# Patient Record
Sex: Female | Born: 1975
Health system: Southern US, Community
[De-identification: ages and names within clinical notes are randomized; demographics above are authoritative.]

## PROBLEM LIST (undated history)

## (undated) DIAGNOSIS — T4145XA Adverse effect of unspecified anesthetic, initial encounter: Secondary | ICD-10-CM

## (undated) DIAGNOSIS — T8859XA Other complications of anesthesia, initial encounter: Secondary | ICD-10-CM

## (undated) DIAGNOSIS — R112 Nausea with vomiting, unspecified: Secondary | ICD-10-CM

## (undated) DIAGNOSIS — D649 Anemia, unspecified: Secondary | ICD-10-CM

## (undated) DIAGNOSIS — Z9889 Other specified postprocedural states: Secondary | ICD-10-CM

## (undated) HISTORY — DX: Anemia, unspecified: D64.9

---

## 2001-08-08 ENCOUNTER — Other Ambulatory Visit: Admission: RE | Admit: 2001-08-08 | Discharge: 2001-08-08 | Payer: Self-pay | Admitting: Obstetrics and Gynecology

## 2002-05-10 ENCOUNTER — Inpatient Hospital Stay (HOSPITAL_COMMUNITY): Admission: AD | Admit: 2002-05-10 | Discharge: 2002-05-13 | Payer: Self-pay | Admitting: Obstetrics and Gynecology

## 2002-07-15 ENCOUNTER — Other Ambulatory Visit: Admission: RE | Admit: 2002-07-15 | Discharge: 2002-07-15 | Payer: Self-pay | Admitting: Obstetrics and Gynecology

## 2003-07-25 ENCOUNTER — Inpatient Hospital Stay (HOSPITAL_COMMUNITY): Admission: AD | Admit: 2003-07-25 | Discharge: 2003-07-27 | Payer: Self-pay | Admitting: Family Medicine

## 2003-08-11 ENCOUNTER — Other Ambulatory Visit: Admission: RE | Admit: 2003-08-11 | Discharge: 2003-08-11 | Payer: Self-pay | Admitting: Obstetrics & Gynecology

## 2004-10-23 ENCOUNTER — Ambulatory Visit (HOSPITAL_COMMUNITY): Admission: RE | Admit: 2004-10-23 | Discharge: 2004-10-23 | Payer: Self-pay | Admitting: Urology

## 2005-06-28 ENCOUNTER — Other Ambulatory Visit: Admission: RE | Admit: 2005-06-28 | Discharge: 2005-06-28 | Payer: Self-pay | Admitting: Obstetrics and Gynecology

## 2011-01-04 ENCOUNTER — Encounter: Payer: Self-pay | Admitting: Internal Medicine

## 2011-01-10 ENCOUNTER — Ambulatory Visit: Payer: Self-pay | Admitting: Family

## 2011-01-26 ENCOUNTER — Ambulatory Visit: Payer: Self-pay | Admitting: Internal Medicine

## 2012-06-12 ENCOUNTER — Ambulatory Visit: Payer: Self-pay | Admitting: Family

## 2012-10-28 ENCOUNTER — Ambulatory Visit: Payer: Self-pay | Admitting: Family

## 2013-01-28 ENCOUNTER — Other Ambulatory Visit: Payer: Self-pay | Admitting: Obstetrics and Gynecology

## 2014-06-17 ENCOUNTER — Other Ambulatory Visit: Payer: Self-pay | Admitting: Obstetrics and Gynecology

## 2014-06-18 LAB — CYTOLOGY - PAP

## 2014-11-22 ENCOUNTER — Telehealth: Payer: Self-pay | Admitting: Behavioral Health

## 2014-11-22 NOTE — Telephone Encounter (Signed)
Unable to reach patient at time of Pre-Visit Call.  Left message for patient to return call when available.    

## 2014-11-23 ENCOUNTER — Ambulatory Visit: Payer: Self-pay | Admitting: Physician Assistant

## 2014-11-23 ENCOUNTER — Telehealth: Payer: Self-pay | Admitting: Physician Assistant

## 2014-11-25 ENCOUNTER — Telehealth: Payer: Self-pay | Admitting: *Deleted

## 2014-11-25 NOTE — Telephone Encounter (Signed)
Unable to reach patient at time of Pre-Visit Call.  Left message for patient to return call when available.    

## 2014-11-26 NOTE — Telephone Encounter (Signed)
No charge. 

## 2014-11-26 NOTE — Telephone Encounter (Signed)
Pt was no show 11/23/14 8:00am, new pt appt, pt has rescheduled new pt with Percell Miller 11/29/14, charge for no show?

## 2014-11-29 ENCOUNTER — Telehealth: Payer: Self-pay | Admitting: Lab

## 2014-11-29 ENCOUNTER — Encounter: Payer: Self-pay | Admitting: Medical

## 2014-11-29 ENCOUNTER — Ambulatory Visit (INDEPENDENT_AMBULATORY_CARE_PROVIDER_SITE_OTHER): Payer: 59 | Admitting: Medical

## 2014-11-29 VITALS — BP 120/57 | HR 72 | Temp 98.5°F | Ht 62.2 in | Wt 119.8 lb

## 2014-11-29 DIAGNOSIS — R1010 Upper abdominal pain, unspecified: Secondary | ICD-10-CM | POA: Diagnosis not present

## 2014-11-29 DIAGNOSIS — R0789 Other chest pain: Secondary | ICD-10-CM | POA: Diagnosis not present

## 2014-11-29 DIAGNOSIS — G44019 Episodic cluster headache, not intractable: Secondary | ICD-10-CM | POA: Diagnosis not present

## 2014-11-29 DIAGNOSIS — R519 Headache, unspecified: Secondary | ICD-10-CM | POA: Insufficient documentation

## 2014-11-29 DIAGNOSIS — R109 Unspecified abdominal pain: Secondary | ICD-10-CM | POA: Insufficient documentation

## 2014-11-29 DIAGNOSIS — D649 Anemia, unspecified: Secondary | ICD-10-CM

## 2014-11-29 DIAGNOSIS — G44009 Cluster headache syndrome, unspecified, not intractable: Secondary | ICD-10-CM | POA: Insufficient documentation

## 2014-11-29 DIAGNOSIS — R5383 Other fatigue: Secondary | ICD-10-CM

## 2014-11-29 DIAGNOSIS — R51 Headache: Secondary | ICD-10-CM

## 2014-11-29 LAB — CBC WITH DIFFERENTIAL/PLATELET
BASOS ABS: 0 10*3/uL (ref 0.0–0.1)
BASOS PCT: 0.2 % (ref 0.0–3.0)
Eosinophils Absolute: 0.2 10*3/uL (ref 0.0–0.7)
Eosinophils Relative: 2.6 % (ref 0.0–5.0)
HCT: 25.1 % — ABNORMAL LOW (ref 36.0–46.0)
LYMPHS PCT: 16.6 % (ref 12.0–46.0)
Lymphs Abs: 1 10*3/uL (ref 0.7–4.0)
MCHC: 29.8 g/dL — ABNORMAL LOW (ref 30.0–36.0)
MONOS PCT: 3.9 % (ref 3.0–12.0)
Monocytes Absolute: 0.2 10*3/uL (ref 0.1–1.0)
Neutro Abs: 4.6 10*3/uL (ref 1.4–7.7)
Neutrophils Relative %: 76.7 % (ref 43.0–77.0)
Platelets: 323 10*3/uL (ref 150.0–400.0)
RBC: 4.21 Mil/uL (ref 3.87–5.11)
RDW: 21.5 % — AB (ref 11.5–15.5)
WBC: 5.9 10*3/uL (ref 4.0–10.5)

## 2014-11-29 LAB — COMPREHENSIVE METABOLIC PANEL
ALK PHOS: 35 U/L — AB (ref 39–117)
ALT: 13 U/L (ref 0–35)
AST: 20 U/L (ref 0–37)
Albumin: 4.2 g/dL (ref 3.5–5.2)
BILIRUBIN TOTAL: 0.3 mg/dL (ref 0.2–1.2)
BUN: 9 mg/dL (ref 6–23)
CALCIUM: 8.8 mg/dL (ref 8.4–10.5)
CHLORIDE: 108 meq/L (ref 96–112)
CO2: 22 mEq/L (ref 19–32)
Creatinine, Ser: 0.62 mg/dL (ref 0.40–1.20)
GFR: 113.13 mL/min (ref >60.00)
Glucose, Bld: 81 mg/dL (ref 70–99)
POTASSIUM: 3.8 meq/L (ref 3.5–5.1)
Sodium: 139 mEq/L (ref 135–145)
Total Protein: 7.2 g/dL (ref 6.0–8.3)

## 2014-11-29 LAB — H. PYLORI ANTIBODY, IGG: H Pylori IgG: POSITIVE — AB

## 2014-11-29 LAB — TSH: TSH: 2.04 u[IU]/mL (ref 0.35–4.50)

## 2014-11-29 LAB — SEDIMENTATION RATE: Sed Rate: 17 mm/hr (ref 0–22)

## 2014-11-29 MED ORDER — FERROUS SULFATE 325 (65 FE) MG PO TABS: 325.0000 mg | ORAL_TABLET | Freq: Three times a day (TID) | ORAL | Status: DC

## 2014-11-29 MED ORDER — RANITIDINE HCL 150 MG PO CAPS: 150.0000 mg | ORAL_CAPSULE | Freq: Two times a day (BID) | ORAL | Status: DC

## 2014-11-29 NOTE — Patient Instructions (Signed)
Cluster headache  When get ha notifiy [place] and could use verapamil. I would recommend much briefer dose prednisone in future. If any headache with neurologic signs or symptosm then ED evaluation.  Pain in the abdomen Will get some basic labs. Rx ranitidine if pain reoccurs. Can use.  Atypical chest pain NO pain now but get basleline ekg for the future to use as comparison. If pain returns.  Fatigue Get cbc, cmp, tsh today.  Temporal headache No pain now in temporal area. But states some occasional and last week. Will get sed rate   Follow up in 2 wks or as needed

## 2014-11-29 NOTE — Assessment & Plan Note (Addendum)
  When get ha notifiy [place] and could use verapamil. I would recommend much briefer dose prednisone in future. If any headache with neurologic signs or symptom then ED evaluation.

## 2014-11-29 NOTE — Assessment & Plan Note (Signed)
Will get some basic labs. Rx ranitidine if pain reoccurs. Can use.

## 2014-11-29 NOTE — Telephone Encounter (Signed)
Call pt tomorrow and advise to start the iron I sent to her pharmacy. She is very anemic. She should start iron asap since she is just above level where transfusion is considered. Come in Friday morning for repeat cbc to make sure stable before the weekend and not dropping. Also want her to  Pick up stool card to test for blood. Please let me know you contacted pt

## 2014-11-29 NOTE — Assessment & Plan Note (Signed)
NO pain now but get basleline ekg for the future to use as comparison. If pain returns.

## 2014-11-29 NOTE — Telephone Encounter (Signed)
Received copy of Critical lab that was faxed. Gave fax to [employer], Later received message from [facility] lab.

## 2014-11-29 NOTE — Progress Notes (Signed)
Subjective:    Patient ID: Margaret Ochoa, female    DOB: 1975/07/12, 39 y.o.   MRN: [id number]  HPI  I have reviewed pt PMH, PSH, FH, Social History and Surgical History  Pt has has occasional cluster ha. Pt takes verapamil and prednisone when she she gets ha. Very rare HA. She takes verapamil 120 mg qid and tapered dose of prednisone. Last ha was in march.  Pt states the last time she had cluster ha in march. She had 2 weeks of taper dose. Which was unusual for her(usually only 4-5 days of taper dose. She states she saw someone different that who she typically saw. So about one week into the steroid tx she suffered from insomnia. It took her about one month for her insomnia to normalize/stop.  Abdominal- 1 week ago mild abdominal pain. Faint pressure. No burning. No burping. Not after eating.   Also Wednesday last week some chest pain. Lasted all day. Then went away. No re occurence. Hx of remote chest pain 20 yrs ago. But much less back tehn and none for 20 years until just recently.   Pt mentions sometimes with occasional bitemporal pain when she eats. Maybe 2 days ago faint left temporal ha.  Also she reports some mild fatigue recently.   Pt is on ocp. Pt has had history of tubal ligation. Pt gynecologist and he put her on. Pt papsmear ws negative at that time.   Pt does not know her family medical history.  Pt is waitress, Pt walks a lot at work, coffee  1 -2 cups a day 4 days a week. Pt states eats healthy, married- 2 children.       Review of Systems  Constitutional: Positive for fatigue. Negative for fever, chills, diaphoresis and activity change.  Respiratory: Negative for cough, chest tightness and shortness of breath.   Cardiovascular: Positive for chest pain. Negative for palpitations and leg swelling.       Not now.  Gastrointestinal: Positive for abdominal pain. Negative for nausea and vomiting.       Not now.  Musculoskeletal: Negative for neck pain and neck  stiffness.  Neurological: Negative for dizziness, tremors, seizures, syncope, facial asymmetry, speech difficulty, weakness, light-headedness, numbness and headaches.       No ha presently. No temporal ha presently. No tmj pain either.  Hematological: Negative for adenopathy. Does not bruise/bleed easily.  Psychiatric/Behavioral: Negative for behavioral problems, confusion and agitation. The patient is not nervous/anxious.     No past medical history on file.  History   Social History  . Marital Status: Married    Spouse Name: N/A  . Number of Children: N/A  . Years of Education: N/A   Occupational History  . Not on file.   Social History Main Topics  . Smoking status: Never Smoker   . Smokeless tobacco: Never Used  . Alcohol Use: No  . Drug Use: No  . Sexual Activity: Yes   Other Topics Concern  . Not on file   Social History Narrative  . No narrative on file    Past Surgical History  Procedure Laterality Date  . Cesarean section      X2    No family history on file.  No Known Allergies  No current outpatient prescriptions on file prior to visit.   No current facility-administered medications on file prior to visit.    BP 120/57 mmHg  Pulse 72  Temp(Src) 98.5 F (36.9 C) (Oral)  Ht  5' 2.2" (1.58 m)  Wt 119 lb 12.8 oz (54.341 kg)  BMI 21.77 kg/m2  SpO2 100%  LMP 11/22/2014       Objective:   Physical Exam  General Mental Status- Alert. General Appearance- Not in acute distress.   Skin General: Color- Normal Color. Moisture- Normal Moisture.  Neck Carotid Arteries- Normal color. Moisture- Normal Moisture. No carotid bruits. No JVD.  Chest and Lung Exam Auscultation: Breath Sounds:-Normal. CTA  Cardiovascular Auscultation:Rythm- Regular, rate, rhythm  Murmurs & Other Heart Sounds:Auscultation of the heart reveals- No Murmurs.  Abdomen Inspection:-Inspeection Normal. Palpation/Percussion:Note:No mass. Palpation and Percussion of the  abdomen reveal- Non Tender, Non Distended + BS, no rebound or guarding.    Neurologic Cranial Nerve exam:- CN III-XII intact(No nystagmus), symmetric smile. Strength:- 5/5 equal and symmetric strength both upper and lower extremities.  Bitemporal area- normal. No inflamed vesels. TMJ- areas no pain on palpation. No crepitus on mastication.       Assessment & Plan:  EKG- looked normal.

## 2014-11-29 NOTE — Progress Notes (Signed)
Pre visit review using our clinic review tool, if applicable. No additional management support is needed unless otherwise documented below in the visit note. 

## 2014-11-29 NOTE — Assessment & Plan Note (Signed)
No pain now in temporal area. But states some occasional and last week. Will get sed rate

## 2014-11-29 NOTE — Assessment & Plan Note (Signed)
Get cbc, cmp, tsh today.

## 2014-11-30 MED ORDER — CLARITHROMYCIN 500 MG PO TABS: 500.0000 mg | ORAL_TABLET | Freq: Two times a day (BID) | ORAL | Status: DC

## 2014-11-30 MED ORDER — AMOXICILLIN 875 MG PO TABS: 875.0000 mg | ORAL_TABLET | Freq: Two times a day (BID) | ORAL | Status: DC

## 2014-11-30 MED ORDER — OMEPRAZOLE 20 MG PO CPDR: 20.0000 mg | DELAYED_RELEASE_CAPSULE | Freq: Every day | ORAL | Status: DC

## 2014-11-30 NOTE — Telephone Encounter (Signed)
Left message for patient to call back ASAP regarding lab results and new orders.

## 2014-11-30 NOTE — Telephone Encounter (Signed)
Left message for patient to call back regarding lab results and new orders.

## 2014-11-30 NOTE — Telephone Encounter (Signed)
Pt notified to start fe for anemia. Start antibioitcs as well for h pylori. Get labs done this Friday.

## 2014-12-01 ENCOUNTER — Other Ambulatory Visit (INDEPENDENT_AMBULATORY_CARE_PROVIDER_SITE_OTHER): Payer: 59

## 2014-12-01 DIAGNOSIS — D649 Anemia, unspecified: Secondary | ICD-10-CM | POA: Diagnosis not present

## 2014-12-01 LAB — IBC PANEL
IRON: 14 ug/dL — AB (ref 42–145)
Saturation Ratios: 2.1 % — ABNORMAL LOW (ref 20.0–50.0)
TRANSFERRIN: 476 mg/dL — AB (ref 212.0–360.0)

## 2014-12-02 LAB — FERRITIN: FERRITIN: 2.7 ng/mL — AB (ref 10.0–291.0)

## 2014-12-03 ENCOUNTER — Telehealth: Payer: Self-pay | Admitting: Medical

## 2014-12-03 ENCOUNTER — Encounter: Payer: Self-pay | Admitting: Medical

## 2014-12-03 ENCOUNTER — Ambulatory Visit (INDEPENDENT_AMBULATORY_CARE_PROVIDER_SITE_OTHER): Payer: 59 | Admitting: Medical

## 2014-12-03 VITALS — BP 106/70 | HR 63 | Temp 98.3°F | Ht 62.2 in | Wt 120.8 lb

## 2014-12-03 DIAGNOSIS — D649 Anemia, unspecified: Secondary | ICD-10-CM

## 2014-12-03 DIAGNOSIS — D6489 Other specified anemias: Secondary | ICD-10-CM

## 2014-12-03 HISTORY — DX: Anemia, unspecified: D64.9

## 2014-12-03 LAB — CBC WITH DIFFERENTIAL/PLATELET
BASOS ABS: 0 10*3/uL (ref 0.0–0.1)
BASOS PCT: 0.3 % (ref 0.0–3.0)
EOS ABS: 0.3 10*3/uL (ref 0.0–0.7)
EOS PCT: 5.7 % — AB (ref 0.0–5.0)
Hemoglobin: 7.5 g/dL — CL (ref 12.0–15.0)
Lymphocytes Relative: 27.2 % (ref 12.0–46.0)
Lymphs Abs: 1.6 10*3/uL (ref 0.7–4.0)
MCHC: 30.1 g/dL (ref 30.0–36.0)
MCV: 60.9 fl — ABNORMAL LOW (ref 78.0–100.0)
MONO ABS: 0.5 10*3/uL (ref 0.1–1.0)
Monocytes Relative: 8 % (ref 3.0–12.0)
NEUTROS ABS: 3.4 10*3/uL (ref 1.4–7.7)
NEUTROS PCT: 58.8 % (ref 43.0–77.0)
Platelets: 340 10*3/uL (ref 150.0–400.0)
RBC: 4.07 Mil/uL (ref 3.87–5.11)
RDW: 21.5 % — ABNORMAL HIGH (ref 11.5–15.5)
WBC: 5.7 10*3/uL (ref 4.0–10.5)

## 2014-12-03 NOTE — Patient Instructions (Signed)
Anemia Will continue iron. Repeat cbc stat and get stool cards(turn in cards next week).    Keep follow up appointment already scheduled. Will contact you on lab results when those are in.

## 2014-12-03 NOTE — Assessment & Plan Note (Signed)
Will continue iron. Repeat cbc stat and get stool cards(turn in cards next week).

## 2014-12-03 NOTE — Telephone Encounter (Signed)
I called pt and informed her that her cbc looks about the exact same. Early in use of fe. So she will continue fe. Bring in stool cards  for testing on wed am. Repeat cbc that morning as well. Get the cbc done stat. And wait for the results. Ask lab to give to doctor of day to review and advise pt.

## 2014-12-03 NOTE — Progress Notes (Signed)
Pre visit review using our clinic review tool, if applicable. No additional management support is needed unless otherwise documented below in the visit note. 

## 2014-12-03 NOTE — Progress Notes (Signed)
   Subjective:    Patient ID: Margaret Ochoa, female    DOB: May 31, 1975, 39 y.o.   MRN: [id number]  HPI  Pt in for follow up on anemia. Pt is starting iron for anemia. I though iron deficient. Pt feeling better. Pt will get blood work today before the weekend. Pt has no gross black or bloody stools. She will get stools cards and turn then in stool cards early next week for cards.  No Gi symptoms.  No severe heavy menses  LMP- within past month.    Review of Systems  Constitutional: Positive for fatigue. Negative for fever and chills.  Respiratory: Negative for cough, chest tightness, shortness of breath and wheezing.   Cardiovascular: Negative for chest pain and palpitations.  Gastrointestinal: Negative for abdominal pain.  Genitourinary: Negative.   Musculoskeletal: Negative for back pain and arthralgias.  Skin: Negative for rash.  Neurological: Negative for dizziness, seizures, weakness and headaches.  Hematological: Negative for adenopathy. Does not bruise/bleed easily.  Psychiatric/Behavioral: Negative for behavioral problems and confusion.    No past medical history on file.  History   Social History  . Marital Status: Married    Spouse Name: N/A  . Number of Children: N/A  . Years of Education: N/A   Occupational History  . Not on file.   Social History Main Topics  . Smoking status: Never Smoker   . Smokeless tobacco: Never Used  . Alcohol Use: No  . Drug Use: No  . Sexual Activity: Yes   Other Topics Concern  . Not on file   Social History Narrative    Past Surgical History  Procedure Laterality Date  . Cesarean section      X2    No family history on file.  No Known Allergies  Current Outpatient Prescriptions on File Prior to Visit  Medication Sig Dispense Refill  . amoxicillin (AMOXIL) 875 MG tablet Take 1 tablet (875 mg total) by mouth 2 (two) times daily. 20 tablet 0  . clarithromycin (BIAXIN) 500 MG tablet Take 1 tablet (500 mg total)  by mouth 2 (two) times daily. 20 tablet 0  . ferrous sulfate 325 (65 FE) MG tablet Take 1 tablet (325 mg total) by mouth 3 (three) times daily with meals. 90 tablet 2  . Norethin-Eth Estrad-Fe Biphas (LO LOESTRIN FE PO) Take by mouth.    Marland Kitchen omeprazole (PRILOSEC) 20 MG capsule Take 1 capsule (20 mg total) by mouth daily. 30 capsule 0  . ranitidine (ZANTAC) 150 MG capsule Take 1 capsule (150 mg total) by mouth 2 (two) times daily. 30 capsule 0   No current facility-administered medications on file prior to visit.    BP 106/70 mmHg  Pulse 63  Temp(Src) 98.3 F (36.8 C) (Oral)  Ht 5' 2.2" (1.58 m)  Wt 120 lb 12.8 oz (54.795 kg)  BMI 21.95 kg/m2  SpO2 100%  LMP 11/22/2014       Objective:   Physical Exam  General- No acute distress. Pleasant patient. Neck- Full range of motion, no jvd Lungs- Clear, even and unlabored. Heart- regular rate and rhythm. Neurologic- CNII- XII grossly intact.       Assessment & Plan:

## 2014-12-10 ENCOUNTER — Other Ambulatory Visit (INDEPENDENT_AMBULATORY_CARE_PROVIDER_SITE_OTHER): Payer: 59

## 2014-12-10 DIAGNOSIS — D649 Anemia, unspecified: Secondary | ICD-10-CM

## 2014-12-10 LAB — CBC WITH DIFFERENTIAL/PLATELET
BASOS ABS: 0.1 10*3/uL (ref 0.0–0.1)
Basophils Relative: 1.1 % (ref 0.0–3.0)
EOS PCT: 7.1 % — AB (ref 0.0–5.0)
Eosinophils Absolute: 0.4 10*3/uL (ref 0.0–0.7)
HEMATOCRIT: 29.5 % — AB (ref 36.0–46.0)
HEMOGLOBIN: 9 g/dL — AB (ref 12.0–15.0)
LYMPHS ABS: 1.1 10*3/uL (ref 0.7–4.0)
LYMPHS PCT: 22.4 % (ref 12.0–46.0)
MCHC: 30.6 g/dL (ref 30.0–36.0)
MCV: 66.3 fl — ABNORMAL LOW (ref 78.0–100.0)
MONOS PCT: 6.8 % (ref 3.0–12.0)
Monocytes Absolute: 0.3 10*3/uL (ref 0.1–1.0)
Neutro Abs: 3.1 10*3/uL (ref 1.4–7.7)
Neutrophils Relative %: 62.6 % (ref 43.0–77.0)
PLATELETS: 279 10*3/uL (ref 150.0–400.0)
RBC: 4.45 Mil/uL (ref 3.87–5.11)
RDW: 21.9 % — ABNORMAL HIGH (ref 11.5–15.5)
WBC: 5 10*3/uL (ref 4.0–10.5)

## 2014-12-10 LAB — POC HEMOCCULT BLD/STL (HOME/3-CARD/SCREEN)
Card #2 Fecal Occult Blod, POC: NEGATIVE
Card #3 Fecal Occult Blood, POC: NEGATIVE
FECAL OCCULT BLD: NEGATIVE

## 2014-12-10 NOTE — Addendum Note (Signed)
Addended by: Tasia Catchings on: 12/10/2014 04:51 PM   Modules accepted: Orders

## 2014-12-14 ENCOUNTER — Ambulatory Visit: Payer: 59 | Admitting: Medical

## 2014-12-16 ENCOUNTER — Ambulatory Visit (INDEPENDENT_AMBULATORY_CARE_PROVIDER_SITE_OTHER): Payer: 59 | Admitting: Medical

## 2014-12-16 ENCOUNTER — Encounter: Payer: Self-pay | Admitting: Medical

## 2014-12-16 VITALS — BP 117/67 | HR 65 | Temp 98.4°F | Ht 62.2 in | Wt 121.2 lb

## 2014-12-16 DIAGNOSIS — D6489 Other specified anemias: Secondary | ICD-10-CM

## 2014-12-16 LAB — CBC WITH DIFFERENTIAL/PLATELET
BASOS PCT: 0.4 % (ref 0.0–3.0)
Basophils Absolute: 0 10*3/uL (ref 0.0–0.1)
EOS PCT: 4.4 % (ref 0.0–5.0)
Eosinophils Absolute: 0.2 10*3/uL (ref 0.0–0.7)
HCT: 33.1 % — ABNORMAL LOW (ref 36.0–46.0)
HEMOGLOBIN: 10.3 g/dL — AB (ref 12.0–15.0)
LYMPHS ABS: 1.1 10*3/uL (ref 0.7–4.0)
Lymphocytes Relative: 22.4 % (ref 12.0–46.0)
MCHC: 31.2 g/dL (ref 30.0–36.0)
MONOS PCT: 5.6 % (ref 3.0–12.0)
Monocytes Absolute: 0.3 10*3/uL (ref 0.1–1.0)
Neutro Abs: 3.3 10*3/uL (ref 1.4–7.7)
Neutrophils Relative %: 67.2 % (ref 43.0–77.0)
Platelets: 230 10*3/uL (ref 150.0–400.0)
RBC: 4.79 Mil/uL (ref 3.87–5.11)
RDW: 36 % — ABNORMAL HIGH (ref 11.5–15.5)
WBC: 4.9 10*3/uL (ref 4.0–10.5)

## 2014-12-16 NOTE — Progress Notes (Signed)
   Subjective:    Patient ID: Margaret Ochoa, female    DOB: 03-18-1976, 39 y.o.   MRN: [id number]  HPI  Pt states her energy is improved. Pt states at work a little slow but when work is busy she gets/has adequate energy. Pt is taking the iron tablets. Pt anemia level was stable. Her stool cards were negative for blood. Pt states April she had heavy cycle. In May pt started ocp. Since then she she has missed two cycle. July had cycle normal. But none this month.  Pt anemia on second check was stable after 1st cbc. Hb/hct did not drop was about the same. But had not been on iron for long.    Review of Systems  Constitutional: Negative for fever, chills and fatigue.       Her fatigue is improving. No longer wanting to chew ice as before.  Respiratory: Negative for cough, chest tightness and wheezing.   Cardiovascular: Negative for chest pain and palpitations.  Musculoskeletal: Negative for back pain.  Neurological: Negative for dizziness and headaches.  Hematological: Negative for adenopathy. Does not bruise/bleed easily.  Psychiatric/Behavioral: Negative for behavioral problems and confusion.    Past Medical History  Diagnosis Date  . Anemia 12/03/2014    Social History   Social History  . Marital Status: Married    Spouse Name: N/A  . Number of Children: N/A  . Years of Education: N/A   Occupational History  . Not on file.   Social History Main Topics  . Smoking status: Never Smoker   . Smokeless tobacco: Never Used  . Alcohol Use: No  . Drug Use: No  . Sexual Activity: Yes   Other Topics Concern  . Not on file   Social History Narrative    Past Surgical History  Procedure Laterality Date  . Cesarean section      X2    No family history on file.  No Known Allergies  Current Outpatient Prescriptions on File Prior to Visit  Medication Sig Dispense Refill  . amoxicillin (AMOXIL) 875 MG tablet Take 1 tablet (875 mg total) by mouth 2 (two) times daily. 20  tablet 0  . clarithromycin (BIAXIN) 500 MG tablet Take 1 tablet (500 mg total) by mouth 2 (two) times daily. 20 tablet 0  . ferrous sulfate 325 (65 FE) MG tablet Take 1 tablet (325 mg total) by mouth 3 (three) times daily with meals. 90 tablet 2  . Norethin-Eth Estrad-Fe Biphas (LO LOESTRIN FE PO) Take by mouth.    Marland Kitchen omeprazole (PRILOSEC) 20 MG capsule Take 1 capsule (20 mg total) by mouth daily. 30 capsule 0  . ranitidine (ZANTAC) 150 MG capsule Take 1 capsule (150 mg total) by mouth 2 (two) times daily. 30 capsule 0   No current facility-administered medications on file prior to visit.    BP 117/67 mmHg  Pulse 65  Temp(Src) 98.4 F (36.9 C) (Oral)  Ht 5' 2.2" (1.58 m)  Wt 121 lb 3.2 oz (54.976 kg)  BMI 22.02 kg/m2  SpO2 100%  LMP 11/22/2014       Objective:   Physical Exam  General- No acute distress. Pleasant patient. Neck- Full range of motion, no jvd Lungs- Clear, even and unlabored. Heart- regular rate and rhythm. Neurologic- CNII- XII grossly intact. Skin- not pale.       Assessment & Plan:

## 2014-12-16 NOTE — Assessment & Plan Note (Signed)
Pt is on Fe. Will get cbc today and notify pt of the results. Expect by now some increase in her hct and hb now.

## 2014-12-16 NOTE — Patient Instructions (Signed)
Anemia Pt is on Fe. Will get cbc today and notify pt of the results. Expect by now some increase in her hct and hb now.   Follow up date  to be determined after lab work up.

## 2014-12-16 NOTE — Progress Notes (Signed)
Pre visit review using our clinic review tool, if applicable. No additional management support is needed unless otherwise documented below in the visit note. 

## 2014-12-17 ENCOUNTER — Telehealth: Payer: Self-pay | Admitting: Medical

## 2014-12-17 NOTE — Telephone Encounter (Signed)
Caller name: TANYLA STEGE Relation to pt: Emergency Contact  Call back number:  [phone]  Reason for call:  Patient did not understand lab results and would like a phone call back. Please advise

## 2014-12-17 NOTE — Telephone Encounter (Signed)
Called patient back with lab results. Left message due to today being a weekend.

## 2015-04-14 ENCOUNTER — Encounter: Payer: Self-pay | Admitting: Medical

## 2015-04-14 ENCOUNTER — Encounter (INDEPENDENT_AMBULATORY_CARE_PROVIDER_SITE_OTHER): Payer: Self-pay

## 2015-04-14 ENCOUNTER — Encounter: Payer: 59 | Admitting: Medical

## 2015-04-14 ENCOUNTER — Ambulatory Visit (INDEPENDENT_AMBULATORY_CARE_PROVIDER_SITE_OTHER): Payer: 59 | Admitting: Medical

## 2015-04-14 VITALS — BP 100/70 | HR 64 | Temp 98.1°F | Ht 62.0 in | Wt 127.2 lb

## 2015-04-14 DIAGNOSIS — R5383 Other fatigue: Secondary | ICD-10-CM

## 2015-04-14 DIAGNOSIS — D649 Anemia, unspecified: Secondary | ICD-10-CM | POA: Diagnosis not present

## 2015-04-14 DIAGNOSIS — J309 Allergic rhinitis, unspecified: Secondary | ICD-10-CM

## 2015-04-14 DIAGNOSIS — N920 Excessive and frequent menstruation with regular cycle: Secondary | ICD-10-CM | POA: Diagnosis not present

## 2015-04-14 DIAGNOSIS — R51 Headache: Secondary | ICD-10-CM | POA: Diagnosis not present

## 2015-04-14 DIAGNOSIS — R519 Headache, unspecified: Secondary | ICD-10-CM

## 2015-04-14 LAB — CBC WITH DIFFERENTIAL/PLATELET
BASOS ABS: 0 10*3/uL (ref 0.0–0.1)
Basophils Relative: 0.6 % (ref 0.0–3.0)
EOS ABS: 0.3 10*3/uL (ref 0.0–0.7)
Eosinophils Relative: 4.9 % (ref 0.0–5.0)
HEMATOCRIT: 35 % — AB (ref 36.0–46.0)
HEMOGLOBIN: 11.5 g/dL — AB (ref 12.0–15.0)
LYMPHS PCT: 25.9 % (ref 12.0–46.0)
Lymphs Abs: 1.4 10*3/uL (ref 0.7–4.0)
MCHC: 32.8 g/dL (ref 30.0–36.0)
MCV: 92.3 fl (ref 78.0–100.0)
Monocytes Absolute: 0.4 10*3/uL (ref 0.1–1.0)
Monocytes Relative: 7 % (ref 3.0–12.0)
Neutro Abs: 3.3 10*3/uL (ref 1.4–7.7)
Neutrophils Relative %: 61.6 % (ref 43.0–77.0)
Platelets: 284 10*3/uL (ref 150.0–400.0)
RBC: 3.8 Mil/uL — AB (ref 3.87–5.11)
RDW: 15.9 % — ABNORMAL HIGH (ref 11.5–15.5)
WBC: 5.4 10*3/uL (ref 4.0–10.5)

## 2015-04-14 LAB — COMPREHENSIVE METABOLIC PANEL
ALBUMIN: 4.2 g/dL (ref 3.5–5.2)
ALT: 41 U/L — AB (ref 0–35)
AST: 33 U/L (ref 0–37)
Alkaline Phosphatase: 47 U/L (ref 39–117)
BILIRUBIN TOTAL: 0.4 mg/dL (ref 0.2–1.2)
BUN: 11 mg/dL (ref 6–23)
CALCIUM: 8.7 mg/dL (ref 8.4–10.5)
CO2: 31 mEq/L (ref 19–32)
CREATININE: 0.55 mg/dL (ref 0.40–1.20)
Chloride: 104 mEq/L (ref 96–112)
GFR: 129.66 mL/min (ref >60.00)
Glucose, Bld: 93 mg/dL (ref 70–99)
Potassium: 3.8 mEq/L (ref 3.5–5.1)
Sodium: 139 mEq/L (ref 135–145)
Total Protein: 6.7 g/dL (ref 6.0–8.3)

## 2015-04-14 LAB — IRON AND TIBC
%SAT: 14 % (ref 11–50)
Iron: 55 ug/dL (ref 40–190)
TIBC: 382 ug/dL (ref 250–450)
UIBC: 327 ug/dL (ref 125–400)

## 2015-04-14 LAB — SEDIMENTATION RATE: Sed Rate: 17 mm/hr (ref 0–22)

## 2015-04-14 LAB — FERRITIN: FERRITIN: 23.5 ng/mL (ref 10.0–291.0)

## 2015-04-14 LAB — TSH: TSH: 2.46 u[IU]/mL (ref 0.35–4.50)

## 2015-04-14 MED ORDER — FLUTICASONE PROPIONATE 50 MCG/ACT NA SUSP: 2.0000 | Freq: Every day | NASAL | Status: DC

## 2015-04-14 MED ORDER — LORATADINE 10 MG PO TABS: 10.0000 mg | ORAL_TABLET | Freq: Every day | ORAL | Status: DC

## 2015-04-14 NOTE — Progress Notes (Signed)
Pre visit review using our clinic review tool, if applicable. No additional management support is needed unless otherwise documented below in the visit note. 

## 2015-04-14 NOTE — Progress Notes (Signed)
Subjective:    Patient ID: Margaret Ochoa, female    DOB: [birth year], 39 y.o.   MRN: [id number]  HPI  Pt has hx of anemia. Pt has been on iron. She states 2 wks ago after heavy menstrual cycle she felt fatigue. She states one day during her menses  she mensturated heavy for 2 hours. Pt states her energy has improved since 2 wks ago. But still tired. That day when she menstruated very heavily she felt extreme fatigue. Pt states rare heavy cycle.  Pt last April had pelvic exam. Normal pap smear.  Pt states they did [place]. Some tissues growth described by pt. Pt states overall description of [place] was benign. Gyn may have been Dr. Williams Che.  Pt has dry cough comes and goes. She describes for one year. Some sneezing at times. Occasional dry eyes and itching. Not real bad symptoms but she wants to bring up. Sometime worse symptoms at night. Pt thinks maybe has allergies.  Hx of cluster ha but no severe reoccurence. Occasional temporal region head ache comes and goes but none presently.  Last saw neurologist in March 2016. She will follow up with him this March.    Review of Systems  Constitutional: Negative for fever, chills and fatigue.  HENT: Positive for congestion. Negative for ear discharge.   Respiratory: Negative for cough, chest tightness, shortness of breath and wheezing.   Cardiovascular: Negative for chest pain and palpitations.  Genitourinary: Negative for dysuria, flank pain, genital sores, vaginal pain and pelvic pain.       Heavy menses 2 weeks ago.   Musculoskeletal: Negative for back pain.  Neurological: Negative for dizziness, weakness, numbness and headaches.       No ha presently.  Hematological: Negative for adenopathy. Does not bruise/bleed easily.  Psychiatric/Behavioral: Negative for behavioral problems and confusion.    Past Medical History  Diagnosis Date  . Anemia 12/03/2014    Social History   Social History  . Marital Status: Married    Spouse Name: N/A  .  Number of Children: N/A  . Years of Education: N/A   Occupational History  . Not on file.   Social History Main Topics  . Smoking status: Never Smoker   . Smokeless tobacco: Never Used  . Alcohol Use: No  . Drug Use: No  . Sexual Activity: Yes   Other Topics Concern  . Not on file   Social History Narrative    Past Surgical History  Procedure Laterality Date  . Cesarean section      X2    No family history on file.  No Known Allergies  Current Outpatient Prescriptions on File Prior to Visit  Medication Sig Dispense Refill  . ferrous sulfate 325 (65 FE) MG tablet Take 1 tablet (325 mg total) by mouth 3 (three) times daily with meals. 90 tablet 2   No current facility-administered medications on file prior to visit.    BP 100/70 mmHg  Pulse 64  Temp(Src) 98.1 F (36.7 C) (Oral)  Ht 5\' 2"  (1.575 m)  Wt 127 lb 3.2 oz (57.698 kg)  BMI 23.26 kg/m2  SpO2 99%  LMP 03/15/2015       Objective:   Physical Exam General- No acute distress. Pleasant patient. Neck- Full range of motion, no jvd Lungs- Clear, even and unlabored. Heart- regular rate and rhythm. Neurologic- CNII- XII grossly intact. Lt temporal area- no swelling, no dilated vein.  Abdomen-soft, nontender, +bs, no rebound or guarding. Back- no cva  tenderness.       Assessment & Plan:  Will get cbc, cmp, tsh and iron studies.  For heavy menses and possible abnromal pelvic US will refer back to Dr. Philis Pique.  For possible allergic rhinitis will rx claritin and flonase.  Continue tx for cluster ha. But since has some mild occasoinal temporal pain will get sed rate today.  Follow up in 1 month or as needed

## 2015-04-14 NOTE — Progress Notes (Signed)
This encounter was created in error - please disregard.

## 2015-04-14 NOTE — Patient Instructions (Addendum)
Will get cbc, cmp, tsh and iron studies.  For heavy menses and possible abnromal pelvic US will refer back to Dr. Philis Pique.  For possible allergic rhinitis will rx claritin and flonase.  Continue tx for cluster ha. But since has some mild occasoinal temporal pain will get sed rate today.  Follow up in 1 month or as needed

## 2015-04-27 ENCOUNTER — Other Ambulatory Visit (INDEPENDENT_AMBULATORY_CARE_PROVIDER_SITE_OTHER): Payer: 59

## 2015-04-27 DIAGNOSIS — N920 Excessive and frequent menstruation with regular cycle: Secondary | ICD-10-CM

## 2015-04-27 DIAGNOSIS — R5383 Other fatigue: Secondary | ICD-10-CM | POA: Diagnosis not present

## 2015-04-27 LAB — CBC WITH DIFFERENTIAL/PLATELET
BASOS ABS: 0 10*3/uL (ref 0.0–0.1)
Basophils Relative: 0.5 % (ref 0.0–3.0)
EOS ABS: 0.3 10*3/uL (ref 0.0–0.7)
Eosinophils Relative: 5.6 % — ABNORMAL HIGH (ref 0.0–5.0)
HCT: 36.1 % (ref 36.0–46.0)
Hemoglobin: 11.9 g/dL — ABNORMAL LOW (ref 12.0–15.0)
LYMPHS ABS: 1.4 10*3/uL (ref 0.7–4.0)
LYMPHS PCT: 24.9 % (ref 12.0–46.0)
MCHC: 33 g/dL (ref 30.0–36.0)
MCV: 91.8 fl (ref 78.0–100.0)
Monocytes Absolute: 0.4 10*3/uL (ref 0.1–1.0)
Monocytes Relative: 6.2 % (ref 3.0–12.0)
NEUTROS ABS: 3.6 10*3/uL (ref 1.4–7.7)
NEUTROS PCT: 62.8 % (ref 43.0–77.0)
PLATELETS: 269 10*3/uL (ref 150.0–400.0)
RBC: 3.93 Mil/uL (ref 3.87–5.11)
RDW: 14.5 % (ref 11.5–15.5)
WBC: 5.8 10*3/uL (ref 4.0–10.5)

## 2015-05-16 ENCOUNTER — Ambulatory Visit: Payer: 59 | Admitting: Medical

## 2017-01-30 DIAGNOSIS — Z01419 Encounter for gynecological examination (general) (routine) without abnormal findings: Secondary | ICD-10-CM | POA: Diagnosis not present

## 2017-02-28 DIAGNOSIS — N920 Excessive and frequent menstruation with regular cycle: Secondary | ICD-10-CM | POA: Diagnosis not present

## 2017-07-18 ENCOUNTER — Ambulatory Visit (INDEPENDENT_AMBULATORY_CARE_PROVIDER_SITE_OTHER): Payer: 59 | Admitting: Medical

## 2017-07-18 ENCOUNTER — Encounter: Payer: Self-pay | Admitting: Medical

## 2017-07-18 ENCOUNTER — Encounter: Payer: 59 | Admitting: Medical

## 2017-07-18 VITALS — BP 120/70 | HR 61 | Temp 98.2°F | Resp 16 | Ht 60.0 in | Wt 133.8 lb

## 2017-07-18 DIAGNOSIS — R300 Dysuria: Secondary | ICD-10-CM

## 2017-07-18 DIAGNOSIS — Z0001 Encounter for general adult medical examination with abnormal findings: Secondary | ICD-10-CM

## 2017-07-18 DIAGNOSIS — Z113 Encounter for screening for infections with a predominantly sexual mode of transmission: Secondary | ICD-10-CM

## 2017-07-18 DIAGNOSIS — R1013 Epigastric pain: Secondary | ICD-10-CM

## 2017-07-18 DIAGNOSIS — Z23 Encounter for immunization: Secondary | ICD-10-CM

## 2017-07-18 DIAGNOSIS — Z0000 Encounter for general adult medical examination without abnormal findings: Secondary | ICD-10-CM

## 2017-07-18 LAB — POC URINALSYSI DIPSTICK (AUTOMATED)
Bilirubin, UA: NEGATIVE
Glucose, UA: NEGATIVE
Ketones, UA: NEGATIVE
Leukocytes, UA: NEGATIVE
Nitrite, UA: NEGATIVE
Protein, UA: NEGATIVE
SPEC GRAV UA: 1.015 (ref 1.010–1.025)
UROBILINOGEN UA: NEGATIVE U/dL — AB
pH, UA: 6.5 (ref 5.0–8.0)

## 2017-07-18 LAB — CBC WITH DIFFERENTIAL/PLATELET
BASOS PCT: 0.5 % (ref 0.0–3.0)
Basophils Absolute: 0 10*3/uL (ref 0.0–0.1)
Eosinophils Absolute: 0.2 10*3/uL (ref 0.0–0.7)
Eosinophils Relative: 3.4 % (ref 0.0–5.0)
HEMATOCRIT: 40.6 % (ref 36.0–46.0)
HEMOGLOBIN: 13.7 g/dL (ref 12.0–15.0)
LYMPHS PCT: 29 % (ref 12.0–46.0)
Lymphs Abs: 2.1 10*3/uL (ref 0.7–4.0)
MCHC: 33.8 g/dL (ref 30.0–36.0)
MCV: 87.4 fl (ref 78.0–100.0)
MONOS PCT: 5.5 % (ref 3.0–12.0)
Monocytes Absolute: 0.4 10*3/uL (ref 0.1–1.0)
Neutro Abs: 4.5 10*3/uL (ref 1.4–7.7)
Neutrophils Relative %: 61.6 % (ref 43.0–77.0)
Platelets: 265 10*3/uL (ref 150.0–400.0)
RBC: 4.64 Mil/uL (ref 3.87–5.11)
RDW: 12.9 % (ref 11.5–15.5)
WBC: 7.2 10*3/uL (ref 4.0–10.5)

## 2017-07-18 NOTE — Progress Notes (Signed)
Subjective:    Patient ID: Margaret Ochoa, female    DOB: [birth year], 42 y.o.   MRN: [id number]  HPI   Pt in for CPE.  Pt feels well.   She is fasting.  She is due for tdap. Pap up to date. Up to date on mammo.    Review of Systems  Constitutional: Negative for chills and fatigue.  Eyes: Negative for pain and redness.  Respiratory: Negative for cough, chest tightness, shortness of breath and wheezing.   Cardiovascular: Negative for chest pain and palpitations.  Gastrointestinal: Negative for abdominal pain.       Mild upset stomach recently. Felt better after h pylori treatment but recent faint epigastric tedner.  Musculoskeletal: Negative for arthralgias, back pain and neck pain.  Skin: Negative for rash.  Neurological: Negative for dizziness, facial asymmetry, speech difficulty, weakness and light-headedness.  Hematological: Negative for adenopathy. Does not bruise/bleed easily.  Psychiatric/Behavioral: Negative for behavioral problems, confusion and sleep disturbance. The patient is not nervous/anxious.     Past Medical History:  Diagnosis Date  . Anemia 12/03/2014     Social History   Socioeconomic History  . Marital status: Married    Spouse name: Not on file  . Number of children: Not on file  . Years of education: Not on file  . Highest education level: Not on file  Occupational History  . Not on file  Social Needs  . Financial resource strain: Not on file  . Food insecurity:    Worry: Not on file    Inability: Not on file  . Transportation needs:    Medical: Not on file    Non-medical: Not on file  Tobacco Use  . Smoking status: Never Smoker  . Smokeless tobacco: Never Used  Substance and Sexual Activity  . Alcohol use: No    Alcohol/week: 0.0 oz  . Drug use: No  . Sexual activity: Yes  Lifestyle  . Physical activity:    Days per week: Not on file    Minutes per session: Not on file  . Stress: Not on file  Relationships  . Social  connections:    Talks on phone: Not on file    Gets together: Not on file    Attends religious service: Not on file    Active member of club or organization: Not on file    Attends meetings of clubs or organizations: Not on file    Relationship status: Not on file  . Intimate partner violence:    Fear of current or ex partner: Not on file    Emotionally abused: Not on file    Physically abused: Not on file    Forced sexual activity: Not on file  Other Topics Concern  . Not on file  Social History Narrative  . Not on file    No family history on file.  No Known Allergies  Current Outpatient Medications on File Prior to Visit  Medication Sig Dispense Refill  . norethindrone-ethinyl estradiol (JUNEL FE 1/20) 1-20 MG-MCG tablet Junel FE 1/20 (28) 1 mg-20 mcg (21)/75 mg (7) tablet  TK 1 T PO QD     No current facility-administered medications on file prior to visit.     BP 120/70   Pulse 61   Temp 98.2 F (36.8 C) (Oral)   Resp 16   Ht 5' (1.524 m)   Wt 133 lb 12.8 oz (60.7 kg)   SpO2 100%   BMI 26.13 kg/m  Objective:   Physical Exam  General Mental Status- Alert. General Appearance- Not in acute distress.   Skin General: Color- Normal Color. Moisture- Normal Moisture.  Worrisome lesions on her back.  Neck Carotid Arteries- Normal color. Moisture- Normal Moisture. No carotid bruits. No JVD.  Chest and Lung Exam Auscultation: Breath Sounds:-Normal.  Cardiovascular Auscultation:Rythm- Regular. Murmurs & Other Heart Sounds:Auscultation of the heart reveals- No Murmurs.  Abdomen Inspection:-Inspeection Normal. Palpation/Percussion:Note:No mass. Palpation and Percussion of the abdomen reveal- Non Tender, Non Distended + BS, no rebound or guarding.    Neurologic Cranial Nerve exam:- CN III-XII intact(No nystagmus), symmetric smile. Strength:- 5/5 equal and symmetric strength both upper and lower extremities.      Assessment & Plan:  For you  wellness exam today I have ordered cbc, cmp, lipid panel, ua and hiv.  Vaccine given today tdap.  Recommend exercise and healthy diet.  We will let you know lab results as they come in.  Follow up date appointment will be determined after lab review.   Will repeat h pylori test today and see if recurrent h pylori.

## 2017-07-18 NOTE — Patient Instructions (Addendum)
For you wellness exam today I have ordered cbc, cmp, lipid panel, ua and hiv.  Vaccine given today tdap.  Recommend exercise and healthy diet.  We will let you know lab results as they come in.  Follow up date appointment will be determined after lab review.   Will repeat h pylori test today and see if recurrent h pylori.    Preventive Care 40-64 Years, Female Preventive care refers to lifestyle choices and visits with your health care provider that can promote health and wellness. What does preventive care include?  A yearly physical exam. This is also called an annual well check.  Dental exams once or twice a year.  Routine eye exams. Ask your health care provider how often you should have your eyes checked.  Personal lifestyle choices, including: ? Daily care of your teeth and gums. ? Regular physical activity. ? Eating a healthy diet. ? Avoiding tobacco and drug use. ? Limiting alcohol use. ? Practicing safe sex. ? Taking low-dose aspirin daily starting at age 30. ? Taking vitamin and mineral supplements as recommended by your health care provider. What happens during an annual well check? The services and screenings done by your health care provider during your annual well check will depend on your age, overall health, lifestyle risk factors, and family history of disease. Counseling Your health care provider may ask you questions about your:  Alcohol use.  Tobacco use.  Drug use.  Emotional well-being.  Home and relationship well-being.  Sexual activity.  Eating habits.  Work and work Statistician.  Method of birth control.  Menstrual cycle.  Pregnancy history.  Screening You may have the following tests or measurements:  Height, weight, and BMI.  Blood pressure.  Lipid and cholesterol levels. These may be checked every 5 years, or more frequently if you are over 60 years old.  Skin check.  Lung cancer screening. You may have this screening  every year starting at age 39 if you have a 30-pack-year history of smoking and currently smoke or have quit within the past 15 years.  Fecal occult blood test (FOBT) of the stool. You may have this test every year starting at age 18.  Flexible sigmoidoscopy or colonoscopy. You may have a sigmoidoscopy every 5 years or a colonoscopy every 10 years starting at age 73.  Hepatitis C blood test.  Hepatitis B blood test.  Sexually transmitted disease (STD) testing.  Diabetes screening. This is done by checking your blood sugar (glucose) after you have not eaten for a while (fasting). You may have this done every 1-3 years.  Mammogram. This may be done every 1-2 years. Talk to your health care provider about when you should start having regular mammograms. This may depend on whether you have a family history of breast cancer.  BRCA-related cancer screening. This may be done if you have a family history of breast, ovarian, tubal, or peritoneal cancers.  Pelvic exam and Pap test. This may be done every 3 years starting at age 16. Starting at age 78, this may be done every 5 years if you have a Pap test in combination with an HPV test.  Bone density scan. This is done to screen for osteoporosis. You may have this scan if you are at high risk for osteoporosis.  Discuss your test results, treatment options, and if necessary, the need for more tests with your health care provider. Vaccines Your health care provider may recommend certain vaccines, such as:  Influenza vaccine. This  is recommended every year.  Tetanus, diphtheria, and acellular pertussis (Tdap, Td) vaccine. You may need a Td booster every 10 years.  Varicella vaccine. You may need this if you have not been vaccinated.  Zoster vaccine. You may need this after age 77.  Measles, mumps, and rubella (MMR) vaccine. You may need at least one dose of MMR if you were born in 1957 or later. You may also need a second dose.  Pneumococcal  13-valent conjugate (PCV13) vaccine. You may need this if you have certain conditions and were not previously vaccinated.  Pneumococcal polysaccharide (PPSV23) vaccine. You may need one or two doses if you smoke cigarettes or if you have certain conditions.  Meningococcal vaccine. You may need this if you have certain conditions.  Hepatitis A vaccine. You may need this if you have certain conditions or if you travel or work in places where you may be exposed to hepatitis A.  Hepatitis B vaccine. You may need this if you have certain conditions or if you travel or work in places where you may be exposed to hepatitis B.  Haemophilus influenzae type b (Hib) vaccine. You may need this if you have certain conditions.  Talk to your health care provider about which screenings and vaccines you need and how often you need them. This information is not intended to replace advice given to you by your health care provider. Make sure you discuss any questions you have with your health care provider. Document Released: 05/13/2015 Document Revised: 01/04/2016 Document Reviewed: 02/15/2015 Elsevier Interactive Patient Education  [employer].

## 2017-07-19 LAB — HIV ANTIBODY (ROUTINE TESTING W REFLEX): HIV 1&2 Ab, 4th Generation: NONREACTIVE

## 2017-07-19 LAB — LIPID PANEL
CHOLESTEROL: 204 mg/dL — AB (ref 0–200)
HDL: 51.6 mg/dL (ref >39.00)
LDL Cholesterol: 123 mg/dL — ABNORMAL HIGH (ref 0–99)
NonHDL: 152.84
TRIGLYCERIDES: 149 mg/dL (ref 0.0–149.0)
Total CHOL/HDL Ratio: 4
VLDL: 29.8 mg/dL (ref 0.0–40.0)

## 2017-07-19 LAB — COMPREHENSIVE METABOLIC PANEL
ALBUMIN: 4.3 g/dL (ref 3.5–5.2)
ALT: 16 U/L (ref 0–35)
AST: 19 U/L (ref 0–37)
Alkaline Phosphatase: 37 U/L — ABNORMAL LOW (ref 39–117)
BILIRUBIN TOTAL: 0.3 mg/dL (ref 0.2–1.2)
BUN: 13 mg/dL (ref 6–23)
CALCIUM: 8.8 mg/dL (ref 8.4–10.5)
CO2: 25 mEq/L (ref 19–32)
CREATININE: 0.54 mg/dL (ref 0.40–1.20)
Chloride: 104 mEq/L (ref 96–112)
GFR: 130.99 mL/min (ref >60.00)
Glucose, Bld: 88 mg/dL (ref 70–99)
Potassium: 4 mEq/L (ref 3.5–5.1)
Sodium: 138 mEq/L (ref 135–145)
Total Protein: 7.4 g/dL (ref 6.0–8.3)

## 2017-07-19 LAB — H. PYLORI BREATH TEST: H. pylori Breath Test: NOT DETECTED

## 2017-07-20 LAB — URINE CULTURE
MICRO NUMBER: 90357140
SPECIMEN QUALITY:: ADEQUATE

## 2017-07-22 ENCOUNTER — Telehealth: Payer: Self-pay | Admitting: Medical

## 2017-07-22 MED ORDER — CIPROFLOXACIN HCL 500 MG PO TABS: 500.0000 mg | ORAL_TABLET | Freq: Two times a day (BID) | ORAL | 0 refills | Status: DC

## 2017-07-22 NOTE — Telephone Encounter (Signed)
Prescription sent to her pharmacy.  Message sent for Rod Holler to call pt back.

## 2017-08-13 ENCOUNTER — Other Ambulatory Visit: Payer: Self-pay | Admitting: Obstetrics and Gynecology

## 2017-08-14 NOTE — Patient Instructions (Addendum)
[facility]  08/14/2017   Your procedure is scheduled on: 08-21-17   Report to [facility]  [facility] Report to Admitting at 10:00 AM    Call this number if you have problems the morning of surgery [phone]   Remember: Do not eat food or drink liquids :After Midnight.     Take these medicines the morning of surgery with A SIP OF WATER:None                                  You may not have any metal on your body including hair pins and              piercings  Do not wear jewelry, make-up, lotions, powders or perfumes, deodorant             Do not wear nail polish.  Do not shave  48 hours prior to surgery.               Do not bring valuables to the hospital. [facility].  Contacts, dentures or bridgework may not be worn into surgery.  Leave suitcase in the car. After surgery it may be brought to your room.                  Please read over the following fact sheets you were given: _____________________________________________________________________          [facility] - Preparing for Surgery Before surgery, you can play an important role.  Because skin is not sterile, your skin needs to be as free of germs as possible.  You can reduce the number of germs on your skin by washing with CHG (chlorahexidine gluconate) soap before surgery.  CHG is an antiseptic cleaner which kills germs and bonds with the skin to continue killing germs even after washing. Please DO NOT use if you have an allergy to CHG or antibacterial soaps.  If your skin becomes reddened/irritated stop using the CHG and inform your nurse when you arrive at Short Stay. Do not shave (including legs and underarms) for at least 48 hours prior to the first CHG shower.  You may shave your face/neck. Please follow these instructions carefully:  1.  Shower with CHG Soap the night before surgery and the  morning of Surgery.  2.  If you choose to  wash your hair, wash your hair first as usual with your  normal  shampoo.  3.  After you shampoo, rinse your hair and body thoroughly to remove the  shampoo.                           4.  Use CHG as you would any other liquid soap.  You can apply chg directly  to the skin and wash                       Gently with a scrungie or clean washcloth.  5.  Apply the CHG Soap to your body ONLY FROM THE NECK DOWN.   Do not use on face/ open  Wound or open sores. Avoid contact with eyes, ears mouth and genitals (private parts).                       Wash face,  Genitals (private parts) with your normal soap.             6.  Wash thoroughly, paying special attention to the area where your surgery  will be performed.  7.  Thoroughly rinse your body with warm water from the neck down.  8.  DO NOT shower/wash with your normal soap after using and rinsing off  the CHG Soap.                9.  Pat yourself dry with a clean towel.            10.  Wear clean pajamas.            11.  Place clean sheets on your bed the night of your first shower and do not  sleep with pets. Day of Surgery : Do not apply any lotions/deodorants the morning of surgery.  Please wear clean clothes to the hospital/surgery center.  FAILURE TO FOLLOW THESE INSTRUCTIONS MAY RESULT IN THE CANCELLATION OF YOUR SURGERY PATIENT SIGNATURE_________________________________  NURSE SIGNATURE__________________________________  ________________________________________________________________________   [name]  An incentive spirometer is a tool that can help keep your lungs clear and active. This tool measures how well you are filling your lungs with each breath. Taking long deep breaths may help reverse or decrease the chance of developing breathing (pulmonary) problems (especially infection) following:  A long period of time when you are unable to move or be active. BEFORE THE PROCEDURE   If the  spirometer includes an indicator to show your best effort, your nurse or respiratory therapist will set it to a desired goal.  If possible, sit up straight or lean slightly forward. Try not to slouch.  Hold the incentive spirometer in an upright position. INSTRUCTIONS FOR USE  1. Sit on the edge of your bed if possible, or sit up as far as you can in bed or on a chair. 2. Hold the incentive spirometer in an upright position. 3. Breathe out normally. 4. Place the mouthpiece in your mouth and seal your lips tightly around it. 5. Breathe in slowly and as deeply as possible, raising the piston or the ball toward the top of the column. 6. Hold your breath for 3-5 seconds or for as long as possible. Allow the piston or ball to fall to the bottom of the column. 7. Remove the mouthpiece from your mouth and breathe out normally. 8. Rest for a few seconds and repeat Steps 1 through 7 at least 10 times every 1-2 hours when you are awake. Take your time and take a few normal breaths between deep breaths. 9. The spirometer may include an indicator to show your best effort. Use the indicator as a goal to work toward during each repetition. 10. After each set of 10 deep breaths, practice coughing to be sure your lungs are clear. If you have an incision (the cut made at the time of surgery), support your incision when coughing by placing a pillow or rolled up towels firmly against it. Once you are able to get out of bed, walk around indoors and cough well. You may stop using the incentive spirometer when instructed by your caregiver.  RISKS AND COMPLICATIONS  Take your time so you do not get  dizzy or light-headed.  If you are in pain, you may need to take or ask for pain medication before doing incentive spirometry. It is harder to take a deep breath if you are having pain. AFTER USE  Rest and breathe slowly and easily.  It can be helpful to keep track of a log of your progress. Your caregiver can provide  you with a simple table to help with this. If you are using the spirometer at home, follow these instructions: [facility] IF:   You are having difficultly using the spirometer.  You have trouble using the spirometer as often as instructed.  Your pain medication is not giving enough relief while using the spirometer.  You develop fever of 100.5 F (38.1 C) or higher. SEEK IMMEDIATE MEDICAL CARE IF:   You cough up bloody sputum that had not been present before.  You develop fever of 102 F (38.9 C) or greater.  You develop worsening pain at or near the incision site. MAKE SURE YOU:   Understand these instructions.  Will watch your condition.  Will get help right away if you are not doing well or get worse. Document Released: 08/27/2006 Document Revised: 07/09/2011 Document Reviewed: 10/28/2006 Exit[employer] Patient Information 2014 [employer], [place].   ________________________________________________________________________  WHAT IS A BLOOD TRANSFUSION? Blood Transfusion Information  A transfusion is the replacement of blood or some of its parts. Blood is made up of multiple cells which provide different functions.  Red blood cells carry oxygen and are used for blood loss replacement.  White blood cells fight against infection.  Platelets control bleeding.  Plasma helps clot blood.  Other blood products are available for specialized needs, such as hemophilia or other clotting disorders. BEFORE THE TRANSFUSION  Who gives blood for transfusions?   Healthy volunteers who are fully evaluated to make sure their blood is safe. This is blood bank blood. Transfusion therapy is the safest it has ever been in the practice of medicine. Before blood is taken from a donor, a complete history is taken to make sure that person has no history of diseases nor engages in risky social behavior (examples are intravenous drug use or sexual activity with multiple partners). The donor's  travel history is screened to minimize risk of transmitting infections, such as malaria. The donated blood is tested for signs of infectious diseases, such as HIV and hepatitis. The blood is then tested to be sure it is compatible with you in order to minimize the chance of a transfusion reaction. If you or a relative donates blood, this is often done in anticipation of surgery and is not appropriate for emergency situations. It takes many days to process the donated blood. RISKS AND COMPLICATIONS Although transfusion therapy is very safe and saves many lives, the main dangers of transfusion include:   Getting an infectious disease.  Developing a transfusion reaction. This is an allergic reaction to something in the blood you were given. Every precaution is taken to prevent this. The decision to have a blood transfusion has been considered carefully by your caregiver before blood is given. Blood is not given unless the benefits outweigh the risks. AFTER THE TRANSFUSION  Right after receiving a blood transfusion, you will usually feel much better and more energetic. This is especially true if your red blood cells have gotten low (anemic). The transfusion raises the level of the red blood cells which carry oxygen, and this usually causes an energy increase.  The nurse administering the transfusion will  monitor you carefully for complications. HOME CARE INSTRUCTIONS  No special instructions are needed after a transfusion. You may find your energy is better. Speak with your caregiver about any limitations on activity for underlying diseases you may have. SEEK MEDICAL CARE IF:   Your condition is not improving after your transfusion.  You develop redness or irritation at the intravenous (IV) site. SEEK IMMEDIATE MEDICAL CARE IF:  Any of the following symptoms occur over the next 12 hours:  Shaking chills.  You have a temperature by mouth above 102 F (38.9 C), not controlled by  medicine.  Chest, back, or muscle pain.  People around you feel you are not acting correctly or are confused.  Shortness of breath or difficulty breathing.  Dizziness and fainting.  You get a rash or develop hives.  You have a decrease in urine output.  Your urine turns a dark color or changes to pink, red, or brown. Any of the following symptoms occur over the next 10 days:  You have a temperature by mouth above 102 F (38.9 C), not controlled by medicine.  Shortness of breath.  Weakness after normal activity.  The white part of the eye turns yellow (jaundice).  You have a decrease in the amount of urine or are urinating less often.  Your urine turns a dark color or changes to pink, red, or brown. Document Released: 04/13/2000 Document Revised: 07/09/2011 Document Reviewed: 12/01/2007 [facility] Patient Information 2014 [place], [place].  _______________________________________________________________________

## 2017-08-15 ENCOUNTER — Encounter (HOSPITAL_COMMUNITY)
Admission: RE | Admit: 2017-08-15 | Discharge: 2017-08-15 | Disposition: A | Discharge status: Home / Self Care | Payer: 59 | Source: Ambulatory Visit | Attending: Obstetrics and Gynecology | Admitting: Obstetrics and Gynecology

## 2017-08-15 ENCOUNTER — Encounter (HOSPITAL_COMMUNITY): Payer: Self-pay

## 2017-08-15 ENCOUNTER — Other Ambulatory Visit: Payer: Self-pay

## 2017-08-15 DIAGNOSIS — Z01812 Encounter for preprocedural laboratory examination: Secondary | ICD-10-CM | POA: Insufficient documentation

## 2017-08-15 HISTORY — DX: Other complications of anesthesia, initial encounter: T88.59XA

## 2017-08-15 HISTORY — DX: Other specified postprocedural states: R11.2

## 2017-08-15 HISTORY — DX: Other specified postprocedural states: Z98.890

## 2017-08-15 HISTORY — DX: Adverse effect of unspecified anesthetic, initial encounter: T41.45XA

## 2017-08-15 LAB — PREGNANCY, URINE: PREG TEST UR: NEGATIVE

## 2017-08-15 LAB — CBC
HCT: 40.1 % (ref 36.0–46.0)
Hemoglobin: 13.3 g/dL (ref 12.0–15.0)
MCH: 29.4 pg (ref 26.0–34.0)
MCHC: 33.2 g/dL (ref 30.0–36.0)
MCV: 88.5 fL (ref 78.0–100.0)
PLATELETS: 275 10*3/uL (ref 150–400)
RBC: 4.53 MIL/uL (ref 3.87–5.11)
RDW: 12.7 % (ref 11.5–15.5)
WBC: 6.3 10*3/uL (ref 4.0–10.5)

## 2017-08-15 LAB — ABO/RH: ABO/RH(D): A POS

## 2017-08-19 NOTE — H&P (Signed)
42 y.o. yo complains of symptomatic fibroid uterus.  Previously: [place] today to f/u on fibroids/tb//\US 7x7x5 but two subserosal fibroids directly on top of this measuring 5cm and 10cm, both pressing down. RO normal, LO not seen, no ff. \Pt has some early am bladder sx from fibroids but no excessive bleeding on OCPs. Uterus is slightly bigger on [place] from previous but fibroids are bigger. Now displacing uterus. Pt feels them now. No pain with sex. \options- lupron depot to shrink fibroids and stop bleeding without OCPs.\cb3 [place] for same.\cb3 Fibroid study."  Pt decided on definitive treatment with hysterectomy.   Past Medical History:  Diagnosis Date  . Anemia 12/03/2014  . Complication of anesthesia   . PONV (postoperative nausea and vomiting)    Past Surgical History:  Procedure Laterality Date  . CESAREAN SECTION     X2    Social History   Socioeconomic History  . Marital status: Married    Spouse name: Not on file  . Number of children: Not on file  . Years of education: Not on file  . Highest education level: Not on file  Occupational History  . Not on file  Social Needs  . Financial resource strain: Not on file  . Food insecurity:    Worry: Not on file    Inability: Not on file  . Transportation needs:    Medical: Not on file    Non-medical: Not on file  Tobacco Use  . Smoking status: Never Smoker  . Smokeless tobacco: Never Used  Substance and Sexual Activity  . Alcohol use: No    Alcohol/week: 0.0 oz  . Drug use: No  . Sexual activity: Yes  Lifestyle  . Physical activity:    Days per week: Not on file    Minutes per session: Not on file  . Stress: Not on file  Relationships  . Social connections:    Talks on phone: Not on file    Gets together: Not on file    Attends religious service: Not on file    Active member of club or organization: Not on file    Attends meetings of clubs or organizations: Not on file    Relationship status: Not on file  . Intimate  partner violence:    Fear of current or ex partner: Not on file    Emotionally abused: Not on file    Physically abused: Not on file    Forced sexual activity: Not on file  Other Topics Concern  . Not on file  Social History Narrative  . Not on file    No current facility-administered medications on file prior to encounter.    No current outpatient medications on file prior to encounter.    No Known Allergies  There were no vitals filed for this visit.  Lungs: clear to ascultation Cor:  RRR Abdomen:  soft, nontender, nondistended. Ex:  no cords, erythema Pelvic:   Vulva: no masses, no atrophy, no lesions\ls1   Vagina: no tenderness, no erythema, no abnormal vaginal discharge, no vesicle(s) or ulcers, no cystocele, no rectocele\ls1   Cervix: grossly normal, no discharge, no cervical motion tenderness\ls1  Uterus: normal size (18, tilted to L, feels like uterus and not cyst of ovary), normal shape, no uterine prolapse, non-tender\ls1   Bladder/Urethra: normal meatus, no urethral discharge, no urethral mass, bladder non distended, Urethra well supported\ls1   Adnexa/Parametria: no parametrial tenderness, no parametrial mass, no adnexal tenderness, no ovarian mass  A:  Symptomatic fibroid uterus  and desires definitive TLH and salpingectomies.   P: All risks, benefits and alternatives d/w patient and she desires to proceed.  Patient has undergone a modified bowel prep and will receive preop antibiotics and SCDs during the operation.     [place] Samford A

## 2017-08-21 ENCOUNTER — Ambulatory Visit (HOSPITAL_COMMUNITY): Payer: 59 | Admitting: Certified Registered"

## 2017-08-21 ENCOUNTER — Encounter (HOSPITAL_COMMUNITY)
Admission: RE | Disposition: A | Discharge status: Home / Self Care | Payer: Self-pay | Source: Ambulatory Visit | Attending: Obstetrics and Gynecology

## 2017-08-21 ENCOUNTER — Telehealth (HOSPITAL_COMMUNITY): Payer: Self-pay | Admitting: *Deleted

## 2017-08-21 ENCOUNTER — Encounter (HOSPITAL_COMMUNITY): Payer: Self-pay | Admitting: General Practice

## 2017-08-21 ENCOUNTER — Observation Stay (HOSPITAL_COMMUNITY)
Admission: RE | Admit: 2017-08-21 | Discharge: 2017-08-22 | Disposition: A | Discharge status: Home / Self Care | Payer: 59 | Source: Ambulatory Visit | Attending: Obstetrics and Gynecology | Admitting: Obstetrics and Gynecology

## 2017-08-21 DIAGNOSIS — D259 Leiomyoma of uterus, unspecified: Secondary | ICD-10-CM | POA: Insufficient documentation

## 2017-08-21 DIAGNOSIS — N920 Excessive and frequent menstruation with regular cycle: Principal | ICD-10-CM | POA: Insufficient documentation

## 2017-08-21 DIAGNOSIS — Z9889 Other specified postprocedural states: Secondary | ICD-10-CM

## 2017-08-21 DIAGNOSIS — N926 Irregular menstruation, unspecified: Secondary | ICD-10-CM | POA: Diagnosis not present

## 2017-08-21 DIAGNOSIS — D649 Anemia, unspecified: Secondary | ICD-10-CM | POA: Diagnosis not present

## 2017-08-21 HISTORY — PX: ROBOTIC ASSISTED LAPAROSCOPIC HYSTERECTOMY AND SALPINGECTOMY: SHX6379

## 2017-08-21 HISTORY — PX: CYSTOSCOPY: SHX5120

## 2017-08-21 LAB — TYPE AND SCREEN
ABO/RH(D): A POS
ANTIBODY SCREEN: NEGATIVE

## 2017-08-21 SURGERY — XI ROBOTIC ASSISTED LAPAROSCOPIC HYSTERECTOMY AND SALPINGECTOMY
Anesthesia: General

## 2017-08-21 MED ORDER — KETOROLAC TROMETHAMINE 30 MG/ML IJ SOLN
30.0000 mg | Freq: Four times a day (QID) | INTRAMUSCULAR | Status: DC
  Administered 2017-08-21 – 2017-08-22 (×2): 30 mg via INTRAVENOUS

## 2017-08-21 MED ORDER — ONDANSETRON HCL 4 MG PO TABS
4.0000 mg | ORAL_TABLET | Freq: Four times a day (QID) | ORAL | Status: DC | PRN
  Filled 2017-08-21: qty 1

## 2017-08-21 MED ORDER — LACTATED RINGERS IV SOLN
INTRAVENOUS | Status: DC
  Administered 2017-08-21 (×2): via INTRAVENOUS

## 2017-08-21 MED ORDER — MEPERIDINE HCL 50 MG/ML IJ SOLN: 6.2500 mg | INTRAMUSCULAR | Status: DC | PRN

## 2017-08-21 MED ORDER — SIMETHICONE 80 MG PO CHEW
CHEWABLE_TABLET | ORAL | Status: AC
  Filled 2017-08-21: qty 1

## 2017-08-21 MED ORDER — SODIUM CHLORIDE 0.9 % IR SOLN
Status: DC | PRN
  Administered 2017-08-21: 3000 mL

## 2017-08-21 MED ORDER — SCOPOLAMINE 1 MG/3DAYS TD PT72
MEDICATED_PATCH | TRANSDERMAL | Status: AC
  Filled 2017-08-21: qty 1

## 2017-08-21 MED ORDER — KETOROLAC TROMETHAMINE 30 MG/ML IJ SOLN
INTRAMUSCULAR | Status: AC
  Filled 2017-08-21: qty 1

## 2017-08-21 MED ORDER — MIDAZOLAM HCL 5 MG/5ML IJ SOLN
INTRAMUSCULAR | Status: DC | PRN
  Administered 2017-08-21: 2 mg via INTRAVENOUS

## 2017-08-21 MED ORDER — HYDROMORPHONE HCL 1 MG/ML IJ SOLN: 0.2500 mg | INTRAMUSCULAR | Status: DC | PRN

## 2017-08-21 MED ORDER — SCOPOLAMINE 1 MG/3DAYS TD PT72
1.0000 | MEDICATED_PATCH | TRANSDERMAL | Status: DC
  Administered 2017-08-21: 1.5 mg via TRANSDERMAL

## 2017-08-21 MED ORDER — LACTATED RINGERS IV BOLUS
500.0000 mL | Freq: Once | INTRAVENOUS | Status: AC
  Administered 2017-08-21: 500 mL via INTRAVENOUS

## 2017-08-21 MED ORDER — KETOROLAC TROMETHAMINE 30 MG/ML IJ SOLN
INTRAMUSCULAR | Status: DC | PRN
  Administered 2017-08-21: 30 mg via INTRAVENOUS

## 2017-08-21 MED ORDER — ROCURONIUM BROMIDE 10 MG/ML (PF) SYRINGE
PREFILLED_SYRINGE | INTRAVENOUS | Status: AC
  Filled 2017-08-21: qty 5

## 2017-08-21 MED ORDER — DEXAMETHASONE SODIUM PHOSPHATE 10 MG/ML IJ SOLN
INTRAMUSCULAR | Status: AC
  Filled 2017-08-21: qty 1

## 2017-08-21 MED ORDER — ONDANSETRON HCL 4 MG/2ML IJ SOLN
4.0000 mg | Freq: Four times a day (QID) | INTRAMUSCULAR | Status: DC | PRN
  Administered 2017-08-21: 4 mg via INTRAVENOUS

## 2017-08-21 MED ORDER — HYDROMORPHONE HCL 1 MG/ML IJ SOLN
INTRAMUSCULAR | Status: DC | PRN
  Administered 2017-08-21 (×2): 1 mg via INTRAVENOUS

## 2017-08-21 MED ORDER — ONDANSETRON HCL 4 MG/2ML IJ SOLN
INTRAMUSCULAR | Status: AC
  Filled 2017-08-21: qty 2

## 2017-08-21 MED ORDER — NITROFURANTOIN MONOHYD MACRO 100 MG PO CAPS
100.0000 mg | ORAL_CAPSULE | Freq: Two times a day (BID) | ORAL | Status: DC
  Administered 2017-08-21: 100 mg via ORAL
  Filled 2017-08-21 (×2): qty 1

## 2017-08-21 MED ORDER — DEXAMETHASONE SODIUM PHOSPHATE 10 MG/ML IJ SOLN
INTRAMUSCULAR | Status: DC | PRN
  Administered 2017-08-21: 10 mg via INTRAVENOUS

## 2017-08-21 MED ORDER — FENTANYL CITRATE (PF) 100 MCG/2ML IJ SOLN
INTRAMUSCULAR | Status: DC | PRN
  Administered 2017-08-21 (×2): 50 ug via INTRAVENOUS
  Administered 2017-08-21: 100 ug via INTRAVENOUS
  Administered 2017-08-21: 50 ug via INTRAVENOUS

## 2017-08-21 MED ORDER — MENTHOL 3 MG MT LOZG
1.0000 | LOZENGE | OROMUCOSAL | Status: DC | PRN
Start: 2017-08-21 — End: 2017-08-22

## 2017-08-21 MED ORDER — FENTANYL CITRATE (PF) 250 MCG/5ML IJ SOLN
INTRAMUSCULAR | Status: AC
  Filled 2017-08-21: qty 5

## 2017-08-21 MED ORDER — ONDANSETRON HCL 4 MG/2ML IJ SOLN
INTRAMUSCULAR | Status: DC | PRN
  Administered 2017-08-21 (×2): 4 mg via INTRAVENOUS

## 2017-08-21 MED ORDER — LIDOCAINE 2% (20 MG/ML) 5 ML SYRINGE
INTRAMUSCULAR | Status: DC | PRN
  Administered 2017-08-21: 100 mg via INTRAVENOUS

## 2017-08-21 MED ORDER — OXYCODONE-ACETAMINOPHEN 5-325 MG PO TABS: 1.0000 | ORAL_TABLET | ORAL | Status: DC | PRN

## 2017-08-21 MED ORDER — ROPIVACAINE HCL 5 MG/ML IJ SOLN
INTRAMUSCULAR | Status: AC
  Filled 2017-08-21: qty 30

## 2017-08-21 MED ORDER — IBUPROFEN 800 MG PO TABS
800.0000 mg | ORAL_TABLET | Freq: Three times a day (TID) | ORAL | Status: DC | PRN
  Administered 2017-08-22: 600 mg via ORAL
  Filled 2017-08-21: qty 1

## 2017-08-21 MED ORDER — SIMETHICONE 80 MG PO CHEW
80.0000 mg | CHEWABLE_TABLET | Freq: Four times a day (QID) | ORAL | Status: DC | PRN
  Administered 2017-08-21: 80 mg via ORAL
  Filled 2017-08-21: qty 1

## 2017-08-21 MED ORDER — PROMETHAZINE HCL 25 MG/ML IJ SOLN: 25.0000 mg | Freq: Four times a day (QID) | INTRAMUSCULAR | Status: DC | PRN

## 2017-08-21 MED ORDER — LIDOCAINE 2% (20 MG/ML) 5 ML SYRINGE
INTRAMUSCULAR | Status: AC
  Filled 2017-08-21: qty 5

## 2017-08-21 MED ORDER — PROPOFOL 10 MG/ML IV BOLUS
INTRAVENOUS | Status: DC | PRN
  Administered 2017-08-21: 150 mg via INTRAVENOUS

## 2017-08-21 MED ORDER — MIDAZOLAM HCL 2 MG/2ML IJ SOLN
INTRAMUSCULAR | Status: AC
  Filled 2017-08-21: qty 2

## 2017-08-21 MED ORDER — STERILE WATER FOR IRRIGATION IR SOLN
Status: DC | PRN
  Administered 2017-08-21: 1000 mL

## 2017-08-21 MED ORDER — SODIUM CHLORIDE 0.9 % IJ SOLN
INTRAMUSCULAR | Status: AC
  Filled 2017-08-21: qty 50

## 2017-08-21 MED ORDER — SODIUM CHLORIDE 0.9 % IJ SOLN
INTRAMUSCULAR | Status: DC | PRN
  Administered 2017-08-21: 50 mL

## 2017-08-21 MED ORDER — SODIUM CHLORIDE 0.9 % IV SOLN
INTRAVENOUS | Status: DC | PRN
  Administered 2017-08-21: 120 mL

## 2017-08-21 MED ORDER — CEFOTETAN DISODIUM-DEXTROSE 2-2.08 GM-%(50ML) IV SOLR
2.0000 g | INTRAVENOUS | Status: AC
  Administered 2017-08-21: 2 g via INTRAVENOUS
  Filled 2017-08-21: qty 50

## 2017-08-21 MED ORDER — ONDANSETRON HCL 4 MG/2ML IJ SOLN
INTRAMUSCULAR | Status: AC
  Filled 2017-08-21: qty 4

## 2017-08-21 MED ORDER — SUGAMMADEX SODIUM 200 MG/2ML IV SOLN
INTRAVENOUS | Status: AC
  Filled 2017-08-21: qty 2

## 2017-08-21 MED ORDER — METOCLOPRAMIDE HCL 5 MG/ML IJ SOLN: 10.0000 mg | Freq: Once | INTRAMUSCULAR | Status: DC | PRN

## 2017-08-21 MED ORDER — ROCURONIUM BROMIDE 100 MG/10ML IV SOLN
INTRAVENOUS | Status: DC | PRN
  Administered 2017-08-21: 20 mg via INTRAVENOUS
  Administered 2017-08-21: 10 mg via INTRAVENOUS
  Administered 2017-08-21: 50 mg via INTRAVENOUS

## 2017-08-21 MED ORDER — SUGAMMADEX SODIUM 200 MG/2ML IV SOLN
INTRAVENOUS | Status: DC | PRN
  Administered 2017-08-21: 150 mg via INTRAVENOUS

## 2017-08-21 MED ORDER — PROPOFOL 10 MG/ML IV BOLUS
INTRAVENOUS | Status: AC
  Filled 2017-08-21: qty 20

## 2017-08-21 MED ORDER — HYDROMORPHONE HCL 2 MG/ML IJ SOLN
INTRAMUSCULAR | Status: AC
  Filled 2017-08-21: qty 1

## 2017-08-21 SURGICAL SUPPLY — 61 items
ADH SKN CLS APL DERMABOND .7 (GAUZE/BANDAGES/DRESSINGS) ×2
BAG URINE DRAINAGE (UROLOGICAL SUPPLIES) ×2 IMPLANT
BARRIER ADHS 3X4 INTERCEED (GAUZE/BANDAGES/DRESSINGS) IMPLANT
BLADE 10 SAFETY STRL DISP (BLADE) ×12 IMPLANT
BRR ADH 4X3 ABS CNTRL BYND (GAUZE/BANDAGES/DRESSINGS)
CANISTER SUCT 3000ML PPV (MISCELLANEOUS) ×4 IMPLANT
CATH FOLEY 2WAY SLVR  5CC 16FR (CATHETERS) ×2
CATH FOLEY 2WAY SLVR 5CC 16FR (CATHETERS) ×2 IMPLANT
COVER BACK TABLE 60X90IN (DRAPES) ×4 IMPLANT
COVER TIP SHEARS 8 DVNC (MISCELLANEOUS) ×2 IMPLANT
COVER TIP SHEARS 8MM DA VINCI (MISCELLANEOUS) ×2
DECANTER SPIKE VIAL GLASS SM (MISCELLANEOUS) ×4 IMPLANT
DEFOGGER SCOPE WARMER CLEARIFY (MISCELLANEOUS) ×4 IMPLANT
DERMABOND ADVANCED (GAUZE/BANDAGES/DRESSINGS) ×2
DERMABOND ADVANCED .7 DNX12 (GAUZE/BANDAGES/DRESSINGS) ×2 IMPLANT
DRAPE ARM DVNC X/XI (DISPOSABLE) ×6 IMPLANT
DRAPE COLUMN DVNC XI (DISPOSABLE) ×2 IMPLANT
DRAPE DA VINCI XI ARM (DISPOSABLE) ×6
DRAPE DA VINCI XI COLUMN (DISPOSABLE) ×2
DURAPREP 26ML APPLICATOR (WOUND CARE) ×4 IMPLANT
ELECT REM PT RETURN 15FT ADLT (MISCELLANEOUS) ×4 IMPLANT
GLOVE BIO SURGEON STRL SZ7 (GLOVE) ×12 IMPLANT
GLOVE BIOGEL PI IND STRL 7.0 (GLOVE) ×4 IMPLANT
GLOVE BIOGEL PI INDICATOR 7.0 (GLOVE) ×4
IRRIG SUCT STRYKERFLOW 2 WTIP (MISCELLANEOUS) ×4
IRRIGATION SUCT STRKRFLW 2 WTP (MISCELLANEOUS) ×2 IMPLANT
LEGGING LITHOTOMY PAIR STRL (DRAPES) ×2 IMPLANT
MANIPULATOR ADVINCU DEL 2.5 PL (MISCELLANEOUS) IMPLANT
MANIPULATOR ADVINCU DEL 3.0 PL (MISCELLANEOUS) ×2 IMPLANT
MANIPULATOR ADVINCU DEL 3.5 PL (MISCELLANEOUS) IMPLANT
MANIPULATOR ADVINCU DEL 4.0 PL (MISCELLANEOUS) IMPLANT
NEEDLE INSUFFLATION 120MM (ENDOMECHANICALS) ×4 IMPLANT
OBTURATOR OPTICAL STANDARD 8MM (TROCAR)
OBTURATOR OPTICAL STND 8 DVNC (TROCAR)
OBTURATOR OPTICALSTD 8 DVNC (TROCAR) ×2 IMPLANT
OCCLUDER COLPOPNEUMO (BALLOONS) ×2 IMPLANT
PACK ROBOT WH (CUSTOM PROCEDURE TRAY) ×4 IMPLANT
PACK ROBOTIC GOWN (GOWN DISPOSABLE) ×4 IMPLANT
PACK TRENDGUARD 450 HYBRID PRO (MISCELLANEOUS) IMPLANT
PAD PREP 24X48 CUFFED NSTRL (MISCELLANEOUS) ×4 IMPLANT
POSITIONER SURGICAL ARM (MISCELLANEOUS) ×10 IMPLANT
POUCH LAPAROSCOPIC INSTRUMENT (MISCELLANEOUS) IMPLANT
SEAL CANN UNIV 5-8 DVNC XI (MISCELLANEOUS) ×6 IMPLANT
SEAL XI 5MM-8MM UNIVERSAL (MISCELLANEOUS) ×8
SET CYSTO W/LG BORE CLAMP LF (SET/KITS/TRAYS/PACK) ×4 IMPLANT
SET TRI-LUMEN FLTR TB AIRSEAL (TUBING) ×4 IMPLANT
SUT DVC VLOC 180 0 12IN GS21 (SUTURE)
SUT VIC AB 2-0 CT2 27 (SUTURE) ×8 IMPLANT
SUT VIC AB 2-0 SH 27 (SUTURE) ×4
SUT VIC AB 2-0 SH 27X BRD (SUTURE) IMPLANT
SUT VIC AB 2-0 UR6 27 (SUTURE) ×4 IMPLANT
SUT VIC AB 3-0 SH 27 (SUTURE) ×8
SUT VIC AB 3-0 SH 27X BRD (SUTURE) IMPLANT
SUT VICRYL RAPIDE 3 0 (SUTURE) ×8 IMPLANT
SUT VLOC 180 0 9IN  GS21 (SUTURE) ×4
SUT VLOC 180 0 9IN GS21 (SUTURE) ×2 IMPLANT
SUTURE DVC VLC 180 0 12IN GS21 (SUTURE) IMPLANT
TOWEL OR 17X26 10 PK STRL BLUE (TOWEL DISPOSABLE) ×6 IMPLANT
TRENDGUARD 450 HYBRID PRO PACK (MISCELLANEOUS) ×4
TROCAR PORT AIRSEAL 5X120 (TROCAR) ×4 IMPLANT
WATER STERILE IRR 1000ML POUR (IV SOLUTION) ×2 IMPLANT

## 2017-08-21 NOTE — Op Note (Signed)
08/21/2017  2:52 PM  PATIENT:  Margaret Ochoa  42 y.o. female  PRE-OPERATIVE DIAGNOSIS:  MENORRHAGIA FIBROIDS  POST-OPERATIVE DIAGNOSIS:  MENORRHAGIA FIBROIDS  PROCEDURE:  Procedure(s) with comments: XI ROBOTIC ASSISTED LAPAROSCOPIC HYSTERECTOMY AND BILATERAL SALPINGECTOMY (Bilateral) - REQUEST BED OVERNIGHT CYSTOSCOPY (N/A) repair of cystotomy  SURGEON:  Surgeon(s) and Role:    * Bobbye Charleston, MD - Primary    * Jerelyn Charles, MD - Assisting  ANESTHESIA:   general  EBL:  150 mL   LOCAL MEDICATIONS USED:  OTHER Ropivicaine  SPECIMEN:  Source of Specimen:  uterus, cervix, bilateral tubes  DISPOSITION OF SPECIMEN:  PATHOLOGY  COUNTS:  YES  TOURNIQUET:  * No tourniquets in log *  DICTATION: .Note written in EPIC  PLAN OF CARE: Admit for overnight observation  PATIENT DISPOSITION:  PACU - hemodynamically stable.   Delay start of Pharmacological VTE agent (>24hrs) due to surgical blood loss or risk of bleeding: not applicable  Complications:  Incidental dome bladder perforation, repaired without comp.    Findings:  16 weeks size uterus.  R ovary was normal.  L ovary had a small cyst.  The ureters were identified during multiple points of the case and were always out of the field of dissection.  On cystoscopy, the bladder was intact and bilateral spill was seen from each ureteral orriface.    Medications:  Ancef.  Ropivicaine.    Technique:  After adequate anesthesia was achieved the patient was positioned, prepped and draped in usual sterile fashion.  A speculum was placed in the vagina and the cervix dilated with pratt dilators.  The Advincula with a 3 cm Koh ring were assembled and placed in proper fashion.  The  Speculum was removed and the bladder catheterized with a foley.    Attention was turned to the abdomen where a 1 cm incision was made 1 cm above the umbilicus.  The veress needle was introduced without aspiration of bowel contents or blood and the  abdomen insufflated. The long 12 mm trocar was placed and the other three trocar sites were marked out, all approximately 10 cm from each other and the camera.  Two 8.5 mm trocars were placed on either side of the camera port and a 5 mm assistant port was placed 3 cm above the L iliac crest.  All trocars were inserted under direct visualization of the camera.  The patient was placed in trendelenburg and then the Robot docked.  The PK forceps were placed on arm 2 and the Hot shears on arm 1 and introduced under direct visualization of the camera.  I then broke scrub and sat down at the console.  The above findings were noted and the ureters identified well out of the field of dissection.  The right fallopian tube was isolated and cauterized with the PK.  The Utero-ovarian ligament was then divided with the PK cautery and shears.  The posterior broad ligament was then divided with the hot shears until the uterosacral ligament.  The Broad and cardinal ligaments were then cauterized against the cervix to the level of the Koh ring, securing the uterine artery.  Each pedicle was then incised with the shears.  The anterior leaf was then incised at the reflection of the vessico-uterine junction and the lateral bladder retracted inferiorly after the round ligament had been divided with the PK forceps.  The left tube was cauterized with the PK and divided with the shears;  then the left utero-ovarian ligament divided with the  PK forceps and the scissors.  The round ligament was divided as well and the posterior leaf of the broad ligament then divided with the hot shears. The broad and cardinal ligaments were then cauterized on the left in the same way.   At the level of the internal os, the uterine arteries were bilaterally cauterized with the PK.  The ureters were identified well out of the field of dissection.  .   The bladder was then able to be retracted inferiorly and the vesico-uterine fascia was incised in the  midline until the bladder was removed one cm below the Koh ring.  The hot shears then circumferentially incised the vagina at the level of the reflection on the [facility] ring. At this point the dissection of the vessico-cervical peritoneum seemed to be several centimeters lower than where the [facility] ring was and the foley bulb could be seen close to this dissection.  The foley was checked and had blood and air in it.  Enfamil was then introduced into the bladder with saline and a 1 cm hole could be seen at top of dome of bladder.  This was repaired with two layers, first 3-0 vicryl ad then 2-0 vicryl.  The bladder was instilled two more times and the repair showed no leaking.  There was no other extravasation of this fluid any where else.   Attention was again turned to the the uterus and cervix which were amputated without complication.  Cautery was used to insure hemostasis of the cuff.  The uterus was too big to be removed intact through there vagina and it was removed in pieces with a long handled scalpel until it the rest could be removed intact.    Attention turned back to the console. Once hemostasis was achieved, the instruments were changed to the mega needle driver and mega suture cut needle driver and the cuff was closed with a running stitches of 0-vicryl V loc.  Cautery was used to ensure hemostasis of the left pedicles very superficially.  The ureters were peristalsing bilaterally well and very lateral to the areas of operation.    The Robot was then undocked and I scrubbed back in.  The needle was removed and Ropivicaine was introduced into the pelvis.  The fascia of the 12 mm trocar was closed with a figure of 8 stitch of 0 vicryl.  The skin incisions were closed with subcuticular stitches of 3-0 vicryl Rapide and Dermabond.  All instruments were removed from the vagina and cystoscopy performed, revealing an intact bladder and vigourous spill of indigo carmine from each ureteral orifice.  The  cystoscope was removed and the patient taken to the recovery room in stable condition.  I spoke with the husband and d/w him the course of recovery with the bladder injury will include keeping the foley for two weeks.   Keirsten Matuska A

## 2017-08-21 NOTE — Progress Notes (Signed)
There has been no change in the patients history, status or exam since the history and physical.  Vitals:   08/21/17 0853 08/21/17 0903  BP: 115/70   Pulse: 71   Resp: 16   Temp: 99.3 F (37.4 C)   TempSrc: Oral   SpO2: 99%   Weight:  131 lb 2 oz (59.5 kg)  Height:  5\' 2"  (1.575 m)    No results found for this or any previous visit (from the past 72 hour(s)).  Makena Murdock A

## 2017-08-21 NOTE — Anesthesia Postprocedure Evaluation (Signed)
Anesthesia Post Note  Patient: Margaret Ochoa  Procedure(s) Performed: XI ROBOTIC ASSISTED LAPAROSCOPIC HYSTERECTOMY AND BILATERAL SALPINGECTOMY (Bilateral ) CYSTOSCOPY (N/A )     Patient location during evaluation: PACU Anesthesia Type: General Level of consciousness: awake and alert and oriented Pain management: pain level controlled Vital Signs Assessment: post-procedure vital signs reviewed and stable Respiratory status: spontaneous breathing, nonlabored ventilation, respiratory function stable and patient connected to nasal cannula oxygen Cardiovascular status: blood pressure returned to baseline and stable Postop Assessment: no apparent nausea or vomiting Anesthetic complications: no    Last Vitals:  Vitals:   08/21/17 1600 08/21/17 1615  BP: 120/75 118/77  Pulse: (!) 102 97  Resp: 13 14  Temp: 36.8 C 36.9 C  SpO2: 98% 99%    Last Pain:  Vitals:   08/21/17 1615  TempSrc:   PainSc: 0-No pain                 [name] A.

## 2017-08-21 NOTE — Anesthesia Procedure Notes (Signed)
Procedure Name: Intubation Date/Time: 08/21/2017 10:24 AM Performed by: Gwyndolyn Saxon, CRNA Pre-anesthesia Checklist: Patient identified, Emergency Drugs available, Suction available, Patient being monitored and Timeout performed Patient Re-evaluated:Patient Re-evaluated prior to induction Oxygen Delivery Method: Circle system utilized Preoxygenation: Pre-oxygenation with 100% oxygen Induction Type: IV induction Ventilation: Mask ventilation without difficulty Laryngoscope Size: Miller and 2 Grade View: Grade I Tube type: Oral Tube size: 7.0 mm Number of attempts: 1 Placement Confirmation: ETT inserted through vocal cords under direct vision,  positive ETCO2,  CO2 detector and breath sounds checked- equal and bilateral Secured at: 21 cm Tube secured with: Tape Dental Injury: Teeth and Oropharynx as per pre-operative assessment

## 2017-08-21 NOTE — Brief Op Note (Signed)
08/21/2017  2:52 PM  PATIENT:  Margaret Ochoa  42 y.o. female  PRE-OPERATIVE DIAGNOSIS:  MENORRHAGIA FIBROIDS  POST-OPERATIVE DIAGNOSIS:  MENORRHAGIA FIBROIDS  PROCEDURE:  Procedure(s) with comments: XI ROBOTIC ASSISTED LAPAROSCOPIC HYSTERECTOMY AND BILATERAL SALPINGECTOMY (Bilateral) - REQUEST BED OVERNIGHT CYSTOSCOPY (N/A) repair of cystotomy  SURGEON:  Surgeon(s) and Role:    * Bobbye Charleston, MD - Primary    * Jerelyn Charles, MD - Assisting  ANESTHESIA:   general  EBL:  150 mL   LOCAL MEDICATIONS USED:  OTHER Ropivicaine  SPECIMEN:  Source of Specimen:  uterus, cervix, bilateral tubes  DISPOSITION OF SPECIMEN:  PATHOLOGY  COUNTS:  YES  TOURNIQUET:  * No tourniquets in log *  DICTATION: .Note written in EPIC  PLAN OF CARE: Admit for overnight observation  PATIENT DISPOSITION:  PACU - hemodynamically stable.   Delay start of Pharmacological VTE agent (>24hrs) due to surgical blood loss or risk of bleeding: not applicable

## 2017-08-21 NOTE — Anesthesia Preprocedure Evaluation (Signed)
Anesthesia Evaluation  Patient identified by MRN, date of birth, ID band Patient awake    Reviewed: Allergy & Precautions, NPO status , Patient's Chart, lab work & pertinent test results  History of Anesthesia Complications (+) PONV and history of anesthetic complications  Airway Mallampati: II  TM Distance: >3 FB Neck ROM: Full    Dental no notable dental hx. (+) Teeth Intact   Pulmonary neg pulmonary ROS,    Pulmonary exam normal breath sounds clear to auscultation       Cardiovascular Normal cardiovascular exam Rhythm:Regular Rate:Normal     Neuro/Psych  Headaches, negative psych ROS   GI/Hepatic negative GI ROS, Neg liver ROS,   Endo/Other  negative endocrine ROS  Renal/GU negative Renal ROS     Musculoskeletal negative musculoskeletal ROS (+)   Abdominal (+) - obese,   Peds  Hematology  (+) anemia ,   Anesthesia Other Findings   Reproductive/Obstetrics Fibroid uterus                             Anesthesia Physical Anesthesia Plan  ASA: II  Anesthesia Plan: General   Post-op Pain Management:    Induction: Intravenous  PONV Risk Score and Plan: Scopolamine patch - Pre-op, Midazolam, Dexamethasone, Ondansetron and Treatment may vary due to age or medical condition  Airway Management Planned: Oral ETT  Additional Equipment:   Intra-op Plan:   Post-operative Plan: Extubation in OR  Informed Consent: I have reviewed the patients History and Physical, chart, labs and discussed the procedure including the risks, benefits and alternatives for the proposed anesthesia with the patient or authorized representative who has indicated his/her understanding and acceptance.   Dental advisory given  Plan Discussed with: CRNA, Anesthesiologist and Surgeon  Anesthesia Plan Comments:         Anesthesia Quick Evaluation

## 2017-08-21 NOTE — Transfer of Care (Signed)
Immediate Anesthesia Transfer of Care Note  Patient: Margaret Ochoa  Procedure(s) Performed: XI ROBOTIC ASSISTED LAPAROSCOPIC HYSTERECTOMY AND BILATERAL SALPINGECTOMY (Bilateral ) CYSTOSCOPY (N/A )  Patient Location: PACU  Anesthesia Type:General  Level of Consciousness: awake, alert  and oriented  Airway & Oxygen Therapy: Patient Spontanous Breathing and Patient connected to face mask oxygen  Post-op Assessment: Report given to RN and Post -op Vital signs reviewed and stable  Post vital signs: Reviewed and stable  Last Vitals:  Vitals Value Taken Time  BP 123/86 08/21/2017  2:56 PM  Temp    Pulse 113 08/21/2017  2:58 PM  Resp 11 08/21/2017  2:58 PM  SpO2 98 % 08/21/2017  2:58 PM  Vitals shown include unvalidated device data.  Last Pain:  Vitals:   08/21/17 0903  TempSrc:   PainSc: 1          Complications: No apparent anesthesia complications

## 2017-08-21 NOTE — Anesthesia Procedure Notes (Signed)
Performed by: Trong Gosling W, CRNA ° ° ° ° ° ° °

## 2017-08-21 NOTE — Progress Notes (Signed)
08/21/2017 2000 Pt. C.o continued nausea post op. Despite prn Zofran administration.  Dr. Philis Pique paged and made aware. Verbal order received ok for Phenergan 25 mg IV q 6 h prn if nausea persists. Verbal order also received for Simethicone 80 mg chewable tablet four times daily prn for gas pain. Orders enacted. Will continue to closely monitor patient.  Neidra Girvan, Arville Lime

## 2017-08-22 ENCOUNTER — Encounter (HOSPITAL_COMMUNITY): Payer: Self-pay | Admitting: Obstetrics and Gynecology

## 2017-08-22 DIAGNOSIS — N920 Excessive and frequent menstruation with regular cycle: Secondary | ICD-10-CM | POA: Diagnosis not present

## 2017-08-22 LAB — CBC
HEMATOCRIT: 30.6 % — AB (ref 36.0–46.0)
Hemoglobin: 10.2 g/dL — ABNORMAL LOW (ref 12.0–15.0)
MCH: 29.4 pg (ref 26.0–34.0)
MCHC: 33.3 g/dL (ref 30.0–36.0)
MCV: 88.2 fL (ref 78.0–100.0)
Platelets: 203 10*3/uL (ref 150–400)
RBC: 3.47 MIL/uL — ABNORMAL LOW (ref 3.87–5.11)
RDW: 13 % (ref 11.5–15.5)
WBC: 11.6 10*3/uL — ABNORMAL HIGH (ref 4.0–10.5)

## 2017-08-22 MED ORDER — IBUPROFEN 800 MG PO TABS: 800.0000 mg | ORAL_TABLET | Freq: Three times a day (TID) | ORAL | 0 refills | Status: DC | PRN

## 2017-08-22 MED ORDER — KETOROLAC TROMETHAMINE 30 MG/ML IJ SOLN
INTRAMUSCULAR | Status: AC
  Filled 2017-08-22: qty 1

## 2017-08-22 MED ORDER — IBUPROFEN 200 MG PO TABS
ORAL_TABLET | ORAL | Status: AC
  Filled 2017-08-22: qty 2

## 2017-08-22 MED ORDER — NITROFURANTOIN MONOHYD MACRO 100 MG PO CAPS: 100.0000 mg | ORAL_CAPSULE | Freq: Every day | ORAL | 0 refills | Status: DC

## 2017-08-22 MED ORDER — IBUPROFEN 200 MG PO TABS
ORAL_TABLET | ORAL | Status: AC
  Filled 2017-08-22: qty 1

## 2017-08-22 MED ORDER — OXYCODONE-ACETAMINOPHEN 5-325 MG PO TABS: 1.0000 | ORAL_TABLET | ORAL | 0 refills | Status: DC | PRN

## 2017-08-22 NOTE — Progress Notes (Signed)
Pt and husband instructed step by step on converting foley drng bag to leg bag during day and back to drng bag at night.  Answered all questions. Pt and husband voiced understanding.  Pt oob in hall w/ standby asst and ambulated approx 300 ft

## 2017-08-22 NOTE — Progress Notes (Signed)
Discharge instructions reviewed with patient and her spouse.  Denies questions. Foley catheter care reviewed with patient and denies questions. Patient comfortable and ready to be discharged.

## 2017-08-22 NOTE — Discharge Instructions (Signed)
You may wash incision with soap and water.    Do not soak the incision for 2 weeks (no tub baths or swimming).    Keep incision dry. You may need to keep a sanitary pad or panty liner between the incision and your clothing for comfort and to keep the incision dry.  If you note drainage, increased pain, or increased redness of the incision, then please notify your physician.  Pelvic rest x 6 weeks (no intercourse or tampons)   No lifting over 10 lbs for 6 weeks.   Do not drive until you are not taking narcotic pain medication AND you can comfortably slam on the brakes.  If you have any concerns about your foley catheter, please call the office    DISCHARGE INSTRUCTIONS: Laparoscopy  The following instructions have been prepared to help you care for yourself upon your return home today.  Wound care:  Do not get the incision wet for the first 24 hours. The incision should be kept clean and dry.  The Band-Aids or dressings may be removed the day after surgery.  Should the incision become sore, red, and swollen after the first week, check with your doctor.  Personal hygiene:  Shower the day after your procedure.  Activity and limitations:  Do NOT drive or operate any equipment today.  Do NOT lift anything more than 15 pounds for 2-3 weeks after surgery.  Do NOT rest in bed all day.  Walking is encouraged. Walk each day, starting slowly with 5-minute walks 3 or 4 times a day. Slowly increase the length of your walks.  Walk up and down stairs slowly.  Do NOT do strenuous activities, such as golfing, playing tennis, bowling, running, biking, weight lifting, gardening, mowing, or vacuuming for 2-4 weeks. Ask your doctor when it is okay to start.  Diet: Eat a light meal as desired this evening. You may resume your usual diet tomorrow.  Return to work: This is dependent on the type of work you do. For the most part you can return to a desk job within a week of surgery. If you are  more active at work, please discuss this with your doctor.  What to expect after your surgery: You may have a slight burning sensation when you urinate on the first day. You may have a very small amount of blood in the urine. Expect to have a small amount of vaginal discharge/light bleeding for 1-2 weeks. It is not unusual to have abdominal soreness and bruising for up to 2 weeks. You may be tired and need more rest for about 1 week. You may experience shoulder pain for 24-72 hours. Lying flat in bed may relieve it.  Call your doctor for any of the following:  Develop a fever of 100.4 or greater  Inability to urinate 6 hours after discharge from hospital  Severe pain not relieved by pain medications  Persistent of heavy bleeding at incision site  Redness or swelling around incision site after a week  Increasing nausea or vomiting

## 2017-08-22 NOTE — Discharge Summary (Signed)
Physician Discharge Summary  Patient ID: DAJANA GEHRIG MRN: [id number] DOB/AGE: [birth year] 42 y.o.  Admit date: 08/21/2017 Discharge date: 08/22/2017  Admission Diagnoses: uterine fibroids Discharge Diagnoses:  Active Problems:   Postoperative state   Discharged Condition: good  Hospital Course: Patient was admitted for a robotic assisted TLH / BS.  Surgery was complicated by incidental cystotomy, repair intraop.  Postoperatively, she was transferred to the floor where she had a routine post op course. She had mild nausea on the evening of POD#0 that resolved with anti-emetics. On POD#1, she was ambulating without difficulty, tolerating a PO diet without nausea or vomiting and pain was controlled with PO pain medication.  She afebrile with stable vital signs and appropriate post op hemoglobin.  She was meeting all goals and desired discharge to home.    Foley catheter was left in situ given bladder repair.  She was discharged with a leg bag for the foley and prophylactic macrobid     Consults: None    Discharge Exam: Blood pressure (!) 103/57, pulse 85, temperature 99.9 F (37.7 C), temperature source Oral, resp. rate 16, height 5\' 2"  (1.575 m), weight 59.5 kg (131 lb 2 oz), last menstrual period 08/07/2017, SpO2 98 %.  Disposition: Discharge disposition: 01-Home or Self Care       Discharge Instructions    Call MD for:  difficulty breathing, headache or visual disturbances   Complete by:  As directed    Call MD for:  extreme fatigue   Complete by:  As directed    Call MD for:  hives   Complete by:  As directed    Call MD for:  persistant dizziness or light-headedness   Complete by:  As directed    Call MD for:  persistant nausea and vomiting   Complete by:  As directed    Call MD for:  redness, tenderness, or signs of infection (pain, swelling, redness, odor or green/yellow discharge around incision site)   Complete by:  As directed    Call MD for:  severe  uncontrolled pain   Complete by:  As directed    Call MD for:  temperature >100.4   Complete by:  As directed    Driving restriction   Complete by:  As directed    Avoid driving for at least 2 weeks.   Lifting restrictions   Complete by:  As directed    Weight restriction of 10 lbs.   No dressing needed   Complete by:  As directed    Sexual acrtivity   Complete by:  As directed    Pelvic rest x 6 weeks (no sex or tampons)     Allergies as of 08/22/2017   No Known Allergies     Medication List    STOP taking these medications   JUNEL FE 1/20 1-20 MG-MCG tablet Generic drug:  norethindrone-ethinyl estradiol     TAKE these medications   ibuprofen 800 MG tablet Commonly known as:  ADVIL,MOTRIN Take 1 tablet (800 mg total) by mouth every 8 (eight) hours as needed (mild pain).   nitrofurantoin (macrocrystal-monohydrate) 100 MG capsule Commonly known as:  MACROBID Take 1 capsule (100 mg total) by mouth at bedtime.   oxyCODONE-acetaminophen 5-325 MG tablet Commonly known as:  PERCOCET/ROXICET Take 1 tablet by mouth every 4 (four) hours as needed for moderate pain ((when tolerating fluids)).      Follow-up Information    Bobbye Charleston, MD. Schedule an appointment as soon as possible for a  visit in 2 week(s).   Specialty:  Obstetrics and Gynecology Contact information: [address] [place] [address] [phone]           Signed: Beryle Quant GEFFEL Kano Heckmann 08/22/2017, 10:55 AM

## 2017-08-22 NOTE — Progress Notes (Signed)
Pt c/o fullness in bladder.  Manipulated tubing w/ immediate results.  Pt stated "feeling much better".  OOB in hall w/ standby asst- ambulated approx 150 feet.  Tolerating po fluids well and in minimal pain and no nausea.

## 2017-08-22 NOTE — Progress Notes (Signed)
Patient is doing well.  She is tolerating PO, ambulating, voiding.  Pain is controlled.  Minimal vb.  Had mild nausea last night that has since resolved and tolerated breakfast this morning without nausea.   Vitals:   08/21/17 1930 08/21/17 2300 08/22/17 0300 08/22/17 0600  BP: 108/64 109/64 (!) 97/51 (!) 103/57  Pulse: 91 81 93 85  Resp: 16 16 16 16   Temp: 98.3 F (36.8 C) 98.6 F (37 C) 100 F (37.8 C) 99.9 F (37.7 C)  TempSrc:  Oral Oral Oral  SpO2: 99% 99% 98% 98%  Weight:      Height:        NAD Lungs:   clear to auscultation Heart:   RRR Abdomen:  soft, appropriate tenderness, incisions intact and without erythema or drainage ext:    Symmetric, no edema bilaterally  Lab Results  Component Value Date   WBC 11.6 (H) 08/22/2017   HGB 10.2 (L) 08/22/2017   HCT 30.6 (L) 08/22/2017   MCV 88.2 08/22/2017   PLT 203 08/22/2017      A/P    42 y.o.  POD 1 s/p robotic TLH / bilateral salpingectomy c/b bladder injury Meeting all post op goals--will d/c to home Foley catheter in place until follow up imaging as outpatient in 14d.

## 2017-08-28 DIAGNOSIS — H538 Other visual disturbances: Secondary | ICD-10-CM | POA: Diagnosis not present

## 2017-09-11 ENCOUNTER — Ambulatory Visit
Admission: RE | Admit: 2017-09-11 | Discharge: 2017-09-11 | Disposition: A | Discharge status: Home / Self Care | Payer: 59 | Source: Ambulatory Visit | Attending: Obstetrics and Gynecology | Admitting: Obstetrics and Gynecology

## 2017-09-11 ENCOUNTER — Other Ambulatory Visit: Payer: Self-pay | Admitting: Obstetrics and Gynecology

## 2017-09-11 DIAGNOSIS — Z9350 Unspecified cystostomy status: Secondary | ICD-10-CM

## 2017-09-13 ENCOUNTER — Other Ambulatory Visit: Payer: Self-pay | Admitting: Obstetrics and Gynecology

## 2017-09-13 ENCOUNTER — Other Ambulatory Visit (HOSPITAL_COMMUNITY): Payer: Self-pay | Admitting: Obstetrics and Gynecology

## 2017-09-13 DIAGNOSIS — S3720XA Unspecified injury of bladder, initial encounter: Secondary | ICD-10-CM

## 2017-09-16 SURGERY — Surgical Case
Anesthesia: *Unknown

## 2017-09-17 ENCOUNTER — Ambulatory Visit (HOSPITAL_COMMUNITY)
Admission: RE | Admit: 2017-09-17 | Discharge: 2017-09-17 | Disposition: A | Discharge status: Home / Self Care | Payer: 59 | Source: Ambulatory Visit | Attending: Obstetrics and Gynecology | Admitting: Obstetrics and Gynecology

## 2017-09-17 ENCOUNTER — Encounter (HOSPITAL_COMMUNITY): Payer: Self-pay | Admitting: Radiology

## 2017-09-17 DIAGNOSIS — S3720XA Unspecified injury of bladder, initial encounter: Secondary | ICD-10-CM | POA: Diagnosis not present

## 2017-09-17 DIAGNOSIS — N9989 Other postprocedural complications and disorders of genitourinary system: Secondary | ICD-10-CM | POA: Diagnosis not present

## 2017-09-17 MED ORDER — IOTHALAMATE MEGLUMINE 17.2 % UR SOLN
250.0000 mL | Freq: Once | URETHRAL | Status: AC | PRN
  Administered 2017-09-17: 250 mL via INTRAVESICAL

## 2017-09-19 ENCOUNTER — Encounter (HOSPITAL_COMMUNITY): Payer: Self-pay | Admitting: Obstetrics and Gynecology

## 2017-10-17 ENCOUNTER — Other Ambulatory Visit: Payer: 59

## 2017-10-23 ENCOUNTER — Other Ambulatory Visit: Payer: 59

## 2017-10-23 ENCOUNTER — Other Ambulatory Visit: Payer: Self-pay | Admitting: *Deleted

## 2017-10-23 DIAGNOSIS — E785 Hyperlipidemia, unspecified: Secondary | ICD-10-CM

## 2017-10-23 NOTE — Addendum Note (Signed)
Addended by: Harl Bowie on: 10/23/2017 03:28 PM   Modules accepted: Orders

## 2017-10-25 ENCOUNTER — Other Ambulatory Visit (INDEPENDENT_AMBULATORY_CARE_PROVIDER_SITE_OTHER): Payer: 59

## 2017-10-25 DIAGNOSIS — N390 Urinary tract infection, site not specified: Secondary | ICD-10-CM | POA: Diagnosis not present

## 2017-10-25 DIAGNOSIS — E785 Hyperlipidemia, unspecified: Secondary | ICD-10-CM | POA: Diagnosis not present

## 2017-10-25 DIAGNOSIS — R300 Dysuria: Secondary | ICD-10-CM | POA: Diagnosis not present

## 2017-10-25 LAB — LIPID PANEL
CHOL/HDL RATIO: 4
Cholesterol: 204 mg/dL — ABNORMAL HIGH (ref 0–200)
HDL: 48.2 mg/dL (ref >39.00)
LDL CALC: 138 mg/dL — AB (ref 0–99)
NONHDL: 155.99
Triglycerides: 88 mg/dL (ref 0.0–149.0)
VLDL: 17.6 mg/dL (ref 0.0–40.0)

## 2017-11-05 ENCOUNTER — Ambulatory Visit: Payer: 59 | Admitting: Medical

## 2017-11-14 DIAGNOSIS — R300 Dysuria: Secondary | ICD-10-CM | POA: Diagnosis not present

## 2017-12-26 DIAGNOSIS — N3021 Other chronic cystitis with hematuria: Secondary | ICD-10-CM | POA: Diagnosis not present

## 2017-12-26 DIAGNOSIS — N390 Urinary tract infection, site not specified: Secondary | ICD-10-CM | POA: Diagnosis not present

## 2017-12-26 DIAGNOSIS — B961 Klebsiella pneumoniae [K. pneumoniae] as the cause of diseases classified elsewhere: Secondary | ICD-10-CM | POA: Diagnosis not present

## 2017-12-26 DIAGNOSIS — N3000 Acute cystitis without hematuria: Secondary | ICD-10-CM | POA: Diagnosis not present

## 2017-12-31 DIAGNOSIS — N390 Urinary tract infection, site not specified: Secondary | ICD-10-CM | POA: Diagnosis not present

## 2017-12-31 DIAGNOSIS — N3021 Other chronic cystitis with hematuria: Secondary | ICD-10-CM | POA: Diagnosis not present

## 2018-01-30 DIAGNOSIS — N390 Urinary tract infection, site not specified: Secondary | ICD-10-CM | POA: Diagnosis not present

## 2018-01-30 DIAGNOSIS — N3021 Other chronic cystitis with hematuria: Secondary | ICD-10-CM | POA: Diagnosis not present

## 2018-01-30 DIAGNOSIS — N959 Unspecified menopausal and perimenopausal disorder: Secondary | ICD-10-CM | POA: Diagnosis not present

## 2018-01-30 DIAGNOSIS — B961 Klebsiella pneumoniae [K. pneumoniae] as the cause of diseases classified elsewhere: Secondary | ICD-10-CM | POA: Diagnosis not present

## 2018-03-25 DIAGNOSIS — R51 Headache: Secondary | ICD-10-CM | POA: Diagnosis not present

## 2018-03-25 DIAGNOSIS — Z79899 Other long term (current) drug therapy: Secondary | ICD-10-CM | POA: Diagnosis not present

## 2018-03-25 DIAGNOSIS — Z049 Encounter for examination and observation for unspecified reason: Secondary | ICD-10-CM | POA: Diagnosis not present

## 2018-03-25 DIAGNOSIS — G44019 Episodic cluster headache, not intractable: Secondary | ICD-10-CM | POA: Diagnosis not present

## 2018-04-04 DIAGNOSIS — Z1231 Encounter for screening mammogram for malignant neoplasm of breast: Secondary | ICD-10-CM | POA: Diagnosis not present

## 2018-04-04 DIAGNOSIS — Z01419 Encounter for gynecological examination (general) (routine) without abnormal findings: Secondary | ICD-10-CM | POA: Diagnosis not present

## 2018-05-05 DIAGNOSIS — N3021 Other chronic cystitis with hematuria: Secondary | ICD-10-CM | POA: Diagnosis not present

## 2018-05-08 ENCOUNTER — Telehealth: Payer: Self-pay | Admitting: *Deleted

## 2018-05-08 NOTE — Telephone Encounter (Signed)
Received Lab Report results from [facility]; forwarded to provider/SLS 01/09

## 2018-08-04 DIAGNOSIS — N390 Urinary tract infection, site not specified: Secondary | ICD-10-CM | POA: Diagnosis not present

## 2018-08-04 DIAGNOSIS — B9620 Unspecified Escherichia coli [E. coli] as the cause of diseases classified elsewhere: Secondary | ICD-10-CM | POA: Diagnosis not present

## 2018-08-04 DIAGNOSIS — N959 Unspecified menopausal and perimenopausal disorder: Secondary | ICD-10-CM | POA: Diagnosis not present

## 2018-08-04 DIAGNOSIS — N3021 Other chronic cystitis with hematuria: Secondary | ICD-10-CM | POA: Diagnosis not present

## 2019-02-16 IMAGING — CT CT ABD-PELV W/O CM
1 of 2 series · 9 of 32 positions shown, 15 images · non-contrast
Comparison: None.

CLINICAL DATA: Status post robotic assisted hysterectomy and
bilateral salpingo oophorectomy

EXAM:
CT ABDOMEN AND PELVIS WITHOUT CONTRAST
TECHNIQUE: Multidetector CT imaging of the abdomen and pelvis was performed
following the standard protocol without IV contrast.

[Series 5: lung · axial · 0.67mm/px · z∈[-101,-23]mm · 9 of 49 slices shown, 15 images]
[im 5/49  soft-tissue]
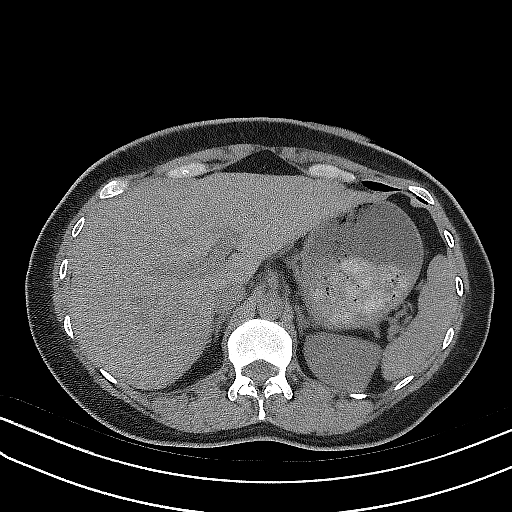
[im 5/49  bone]
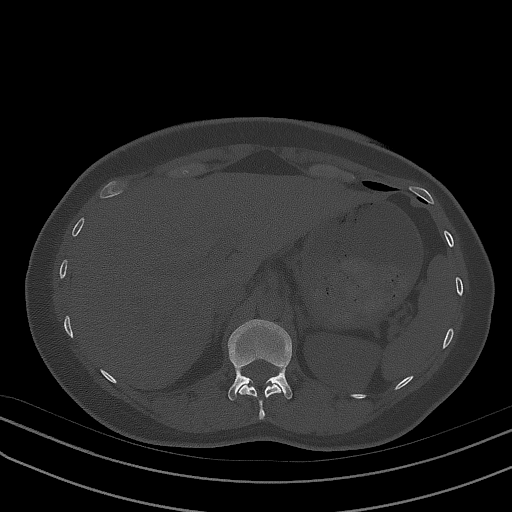
[im 10/49  soft-tissue]
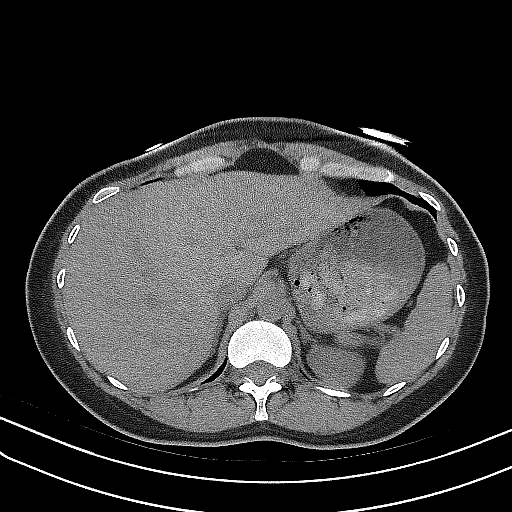
[im 15/49  soft-tissue]
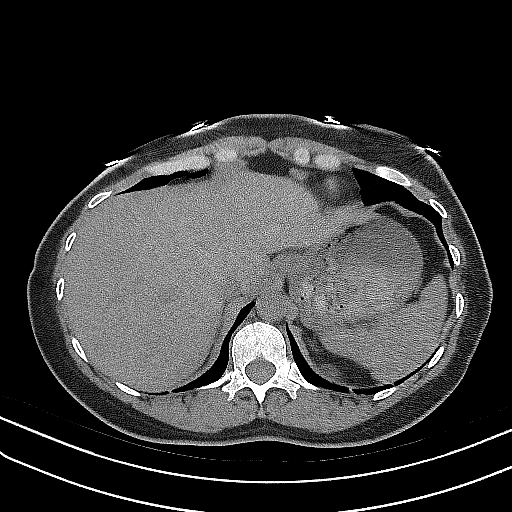
[im 20/49  soft-tissue]
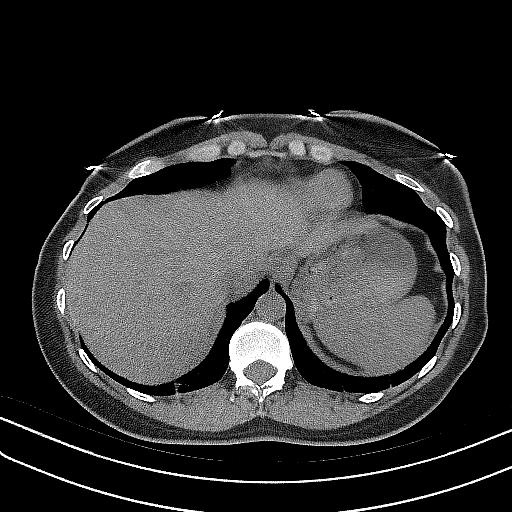
[im 25/49  soft-tissue]
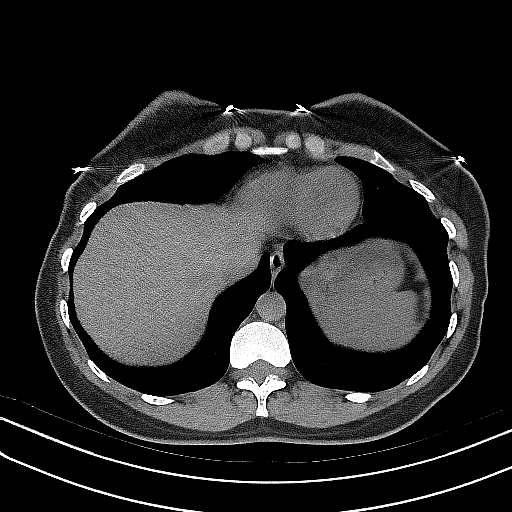
[im 29/49  soft-tissue]
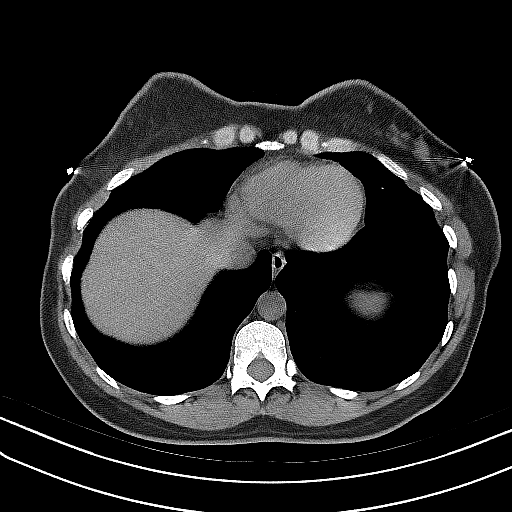
[im 29/49  lung]
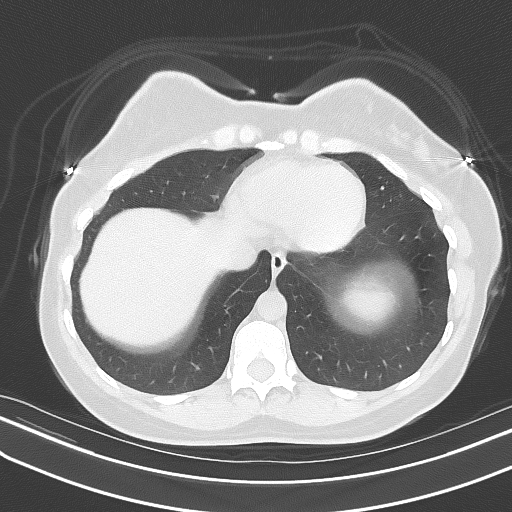
[im 34/49  soft-tissue]
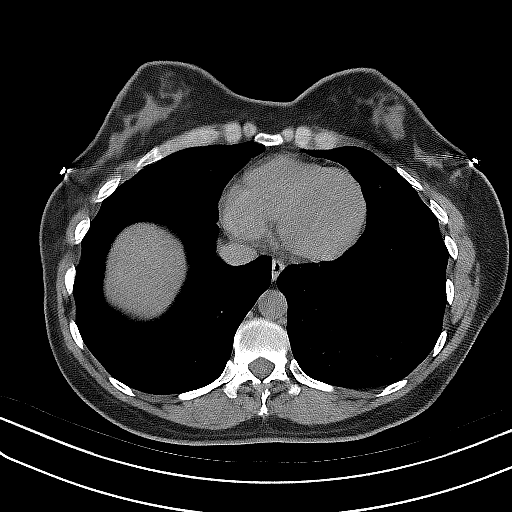
[im 34/49  lung]
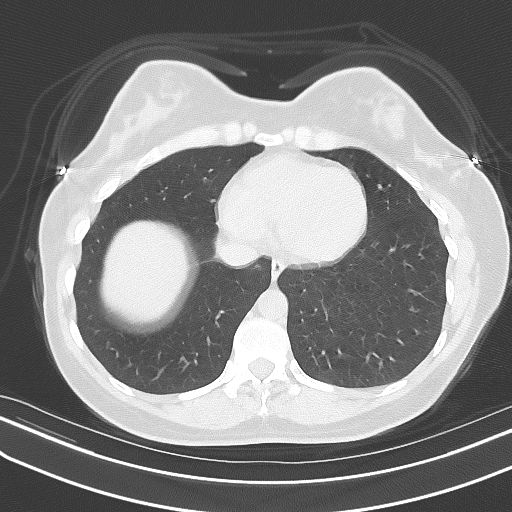
[im 39/49  soft-tissue]
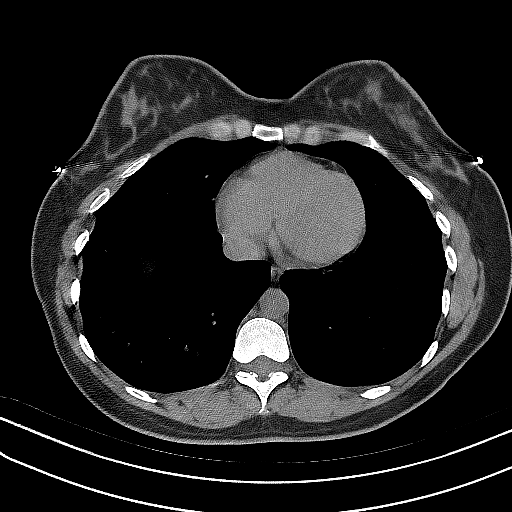
[im 39/49  lung]
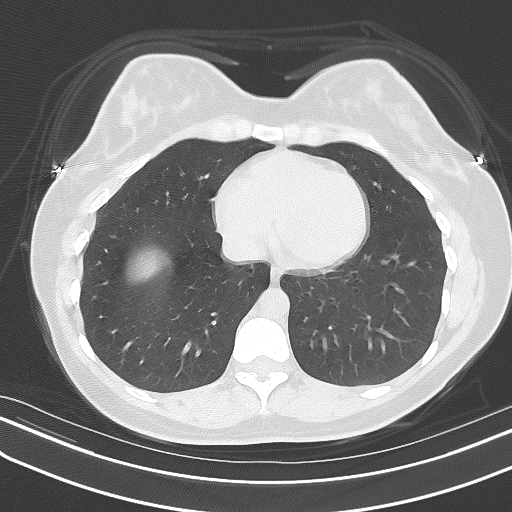
[im 44/49  soft-tissue]
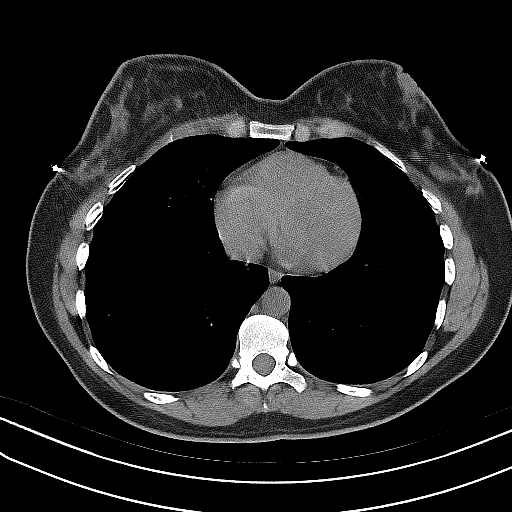
[im 44/49  lung]
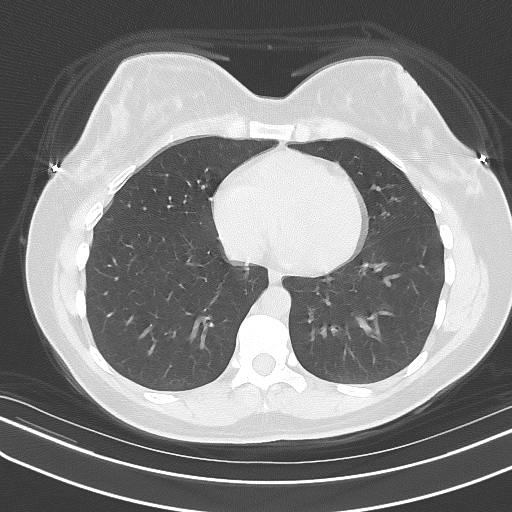
[im 44/49  bone]
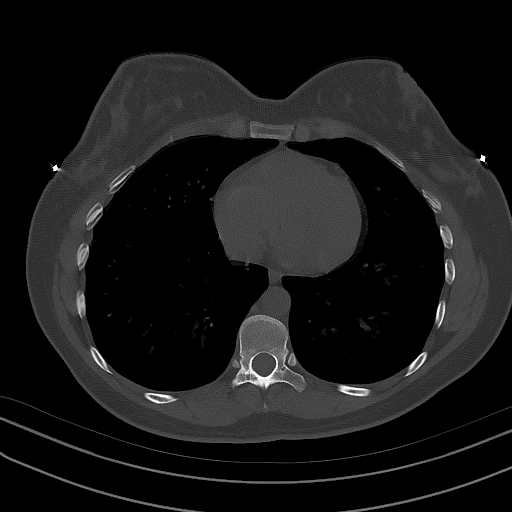

[9 of 32 positions shown; findings below may reference images not displayed]

FINDINGS: Lower chest: No acute abnormality.

Hepatobiliary: Calcified granuloma identified in left lobe of liver.
No suspicious liver abnormalities. Gallbladder normal. No biliary
dilatation.

Pancreas: Unremarkable. No pancreatic ductal dilatation or
surrounding inflammatory changes.

Spleen: Normal in size without focal abnormality.

Adrenals/Urinary Tract: Normal adrenal glands. No kidney stone or
hydronephrosis identified. Foley catheter identified within the
urinary bladder.

Stomach/Bowel: Stomach is normal. The small bowel loops have a
normal course and caliber. No bowel obstruction. Unremarkable
appearance of the colon.

Vascular/Lymphatic: Normal appearance of the abdominal aorta. No
enlarged retroperitoneal or mesenteric adenopathy. No enlarged
pelvic or inguinal lymph nodes.

Reproductive: Postoperative change from recent hysterectomy and
bilateral salpingo oophorectomy. Mild soft tissue stranding within
the peritoneal fat of the pelvis, likely postoperative..

Other: No free fluid or fluid collections identified within the
abdomen or pelvis.

Musculoskeletal: No acute or significant osseous findings.
IMPRESSION: 1. Status post hysterectomy and bilateral salpingo oophorectomy.
2. No complicating features identified to explain patient's painful
urination.
3. No evidence for abscess or bowel obstruction.

## 2020-04-05 ENCOUNTER — Other Ambulatory Visit: Payer: Self-pay

## 2020-04-05 ENCOUNTER — Ambulatory Visit (HOSPITAL_BASED_OUTPATIENT_CLINIC_OR_DEPARTMENT_OTHER)
Admission: RE | Admit: 2020-04-05 | Discharge: 2020-04-05 | Disposition: A | Discharge status: Home / Self Care | Payer: 59 | Source: Ambulatory Visit | Attending: Medical | Admitting: Medical

## 2020-04-05 ENCOUNTER — Ambulatory Visit (INDEPENDENT_AMBULATORY_CARE_PROVIDER_SITE_OTHER): Payer: 59 | Admitting: Medical

## 2020-04-05 VITALS — BP 101/53 | HR 81 | Temp 98.5°F | Resp 12 | Ht 60.0 in | Wt 131.6 lb

## 2020-04-05 DIAGNOSIS — Z23 Encounter for immunization: Secondary | ICD-10-CM

## 2020-04-05 DIAGNOSIS — M79671 Pain in right foot: Secondary | ICD-10-CM | POA: Insufficient documentation

## 2020-04-05 DIAGNOSIS — S91311A Laceration without foreign body, right foot, initial encounter: Secondary | ICD-10-CM

## 2020-04-05 MED ORDER — CEPHALEXIN 500 MG PO CAPS: 500.0000 mg | ORAL_CAPSULE | Freq: Three times a day (TID) | ORAL | 0 refills | Status: AC

## 2020-04-05 NOTE — Addendum Note (Signed)
Addended by: Kem Boroughs D on: 04/05/2020 03:28 PM   Modules accepted: Orders

## 2020-04-05 NOTE — Progress Notes (Signed)
Subjective:    Patient ID: Margaret Ochoa, female    DOB: 1975/06/10, 44 y.o.   MRN: [id number]  HPI  Pt in with rt foot trauma. This happened yesterday at work. She was walking across tile threshold bordered by think strip metal with point. Somehow metal piece piered top distal aspect of her rt foot.  Accident occurred 7 pm or little afterward now 19 hours post accident.  Area is swollen and tender later to area that was pierced.  Pt is up to date on tetanus vaccine.  Pt did mention injury to work.     Review of Systems  Constitutional: Negative for chills, fatigue and fever.  Respiratory: Negative for cough, chest tightness, shortness of breath and wheezing.   Cardiovascular: Negative for chest pain and palpitations.  Skin:       Rt foot laceration.   Neurological: Negative for dizziness and headaches.  Hematological: Negative for adenopathy. Does not bruise/bleed easily.    Past Medical History:  Diagnosis Date  . Anemia 12/03/2014  . Complication of anesthesia   . PONV (postoperative nausea and vomiting)      Social History   Socioeconomic History  . Marital status: Married    Spouse name: Not on file  . Number of children: Not on file  . Years of education: Not on file  . Highest education level: Not on file  Occupational History  . Not on file  Tobacco Use  . Smoking status: Never Smoker  . Smokeless tobacco: Never Used  Vaping Use  . Vaping Use: Never used  Substance and Sexual Activity  . Alcohol use: No    Alcohol/week: 0.0 standard drinks  . Drug use: No  . Sexual activity: Yes  Other Topics Concern  . Not on file  Social History Narrative  . Not on file   Social Determinants of Health   Financial Resource Strain:   . Difficulty of Paying Living Expenses: Not on file  Food Insecurity:   . Worried About Charity fundraiser in the Last Year: Not on file  . Ran Out of Food in the Last Year: Not on file  Transportation Needs:   . Lack of  Transportation (Medical): Not on file  . Lack of Transportation (Non-Medical): Not on file  Physical Activity:   . Days of Exercise per Week: Not on file  . Minutes of Exercise per Session: Not on file  Stress:   . Feeling of Stress : Not on file  Social Connections:   . Frequency of Communication with Friends and Family: Not on file  . Frequency of Social Gatherings with Friends and Family: Not on file  . Attends Religious Services: Not on file  . Active Member of Clubs or Organizations: Not on file  . Attends Archivist Meetings: Not on file  . Marital Status: Not on file  Intimate Partner Violence:   . Fear of Current or Ex-Partner: Not on file  . Emotionally Abused: Not on file  . Physically Abused: Not on file  . Sexually Abused: Not on file    Past Surgical History:  Procedure Laterality Date  . CESAREAN SECTION     X2  . CYSTOSCOPY N/A 08/21/2017   Procedure: CYSTOSCOPY;  Surgeon: Bobbye Charleston, MD;  Location: [facility];  Service: Gynecology;  Laterality: N/A;  . ROBOTIC ASSISTED LAPAROSCOPIC HYSTERECTOMY AND SALPINGECTOMY Bilateral 08/21/2017   Procedure: XI ROBOTIC ASSISTED LAPAROSCOPIC HYSTERECTOMY AND BILATERAL SALPINGECTOMY;  Surgeon: Bobbye Charleston, MD;  Location: [facility];  Service: Gynecology;  Laterality: Bilateral;  REQUEST BED OVERNIGHT    No family history on file.  No Known Allergies  No current outpatient medications on file prior to visit.   No current facility-administered medications on file prior to visit.    BP (!) 101/53 (BP Location: Right Arm, Cuff Size: Normal)   Pulse 81   Temp 98.5 F (36.9 C) (Oral)   Resp 12   Ht 5' (1.524 m)   Wt 131 lb 9.6 oz (59.7 kg)   LMP 08/07/2017   SpO2 98%   BMI 25.70 kg/m      Objective:   Physical Exam  General- No acute distress. Pleasant patient. Neck- Full range of motion, no jvd Lungs- Clear, even and unlabored. Heart- regular rate and rhythm. Neurologic- CNII- XII grossly intact.   Rt foot- top foot over distal metatarsal region 2nd toe has tiny 2 mm laceration which appears to be already healing/scab present. No dc. Lateral to puncture/laceration moderate tender to light palpation.  Pt can move all her toes. Good sensation.      Assessment & Plan:  Foot laceration/puncture wound already 19 hour or so past injury. Already healing. Late in presentation so sutures not indicated.   Tetanus is updated. Will get xray to rule out fracture and see if any foreign body. Can [place] ibuprofen for pain. Work excuse given today.  No obvious infection but if redness, swelling more or dc from wound then start keflex.  Can use low dose ibuprofen if needed for pain.  Follow up 5 days or as needed  [employer], [employer]

## 2020-04-05 NOTE — Patient Instructions (Addendum)
Foot laceration/puncture wound already 19 hour or so past injury. Already healing. Late in presentation so sutures not indicated.   Tetanus is updated. Will get xray to rule out fracture and see if any foreign body. Can [place] ibuprofen for pain. Work excuse given today.  No obvious infection but if redness, swelling more or dc from wound then start keflex.  Can soak in warm salt water and apply triple antibiotic to wound area.  Can use low dose ibuprofen if needed for pain.  Follow up 5 days or as needed

## 2020-04-07 ENCOUNTER — Telehealth: Payer: Self-pay | Admitting: Medical

## 2020-04-07 NOTE — Telephone Encounter (Signed)
Caller:husband Kristopher Call [phone]    Wife was seen a few days ago. Wants to know more info about how they are filling it.   I did give him the billing number for this call.

## 2021-07-05 ENCOUNTER — Encounter: Payer: Self-pay | Admitting: *Deleted

## 2021-07-27 ENCOUNTER — Other Ambulatory Visit: Payer: Self-pay | Admitting: Obstetrics and Gynecology

## 2021-07-27 DIAGNOSIS — R928 Other abnormal and inconclusive findings on diagnostic imaging of breast: Secondary | ICD-10-CM

## 2021-08-22 ENCOUNTER — Ambulatory Visit: Admission: RE | Admit: 2021-08-22 | Payer: 59 | Source: Ambulatory Visit

## 2021-08-22 ENCOUNTER — Other Ambulatory Visit: Payer: Self-pay | Admitting: Obstetrics and Gynecology

## 2021-08-22 ENCOUNTER — Ambulatory Visit
Admission: RE | Admit: 2021-08-22 | Discharge: 2021-08-22 | Disposition: A | Discharge status: Home / Self Care | Payer: 59 | Source: Ambulatory Visit | Attending: Obstetrics and Gynecology | Admitting: Obstetrics and Gynecology

## 2021-08-22 DIAGNOSIS — R928 Other abnormal and inconclusive findings on diagnostic imaging of breast: Secondary | ICD-10-CM

## 2022-03-15 ENCOUNTER — Other Ambulatory Visit: Payer: Self-pay | Admitting: Obstetrics and Gynecology

## 2022-03-15 DIAGNOSIS — R928 Other abnormal and inconclusive findings on diagnostic imaging of breast: Secondary | ICD-10-CM

## 2023-02-20 ENCOUNTER — Inpatient Hospital Stay
Admission: RE | Admit: 2023-02-20 | Discharge: 2023-02-20 | Disposition: A | Discharge status: Home / Self Care | Payer: 59 | Source: Ambulatory Visit | Attending: Obstetrics and Gynecology | Admitting: Obstetrics and Gynecology

## 2023-02-20 ENCOUNTER — Other Ambulatory Visit: Payer: Self-pay | Admitting: Obstetrics and Gynecology

## 2023-02-20 DIAGNOSIS — R928 Other abnormal and inconclusive findings on diagnostic imaging of breast: Secondary | ICD-10-CM

## 2024-02-17 ENCOUNTER — Other Ambulatory Visit: Payer: Self-pay | Admitting: Obstetrics and Gynecology

## 2024-02-17 DIAGNOSIS — R928 Other abnormal and inconclusive findings on diagnostic imaging of breast: Secondary | ICD-10-CM

## 2024-02-18 ENCOUNTER — Other Ambulatory Visit: Payer: Self-pay | Admitting: Obstetrics and Gynecology

## 2024-02-18 DIAGNOSIS — R928 Other abnormal and inconclusive findings on diagnostic imaging of breast: Secondary | ICD-10-CM

## 2024-02-18 DIAGNOSIS — N6489 Other specified disorders of breast: Secondary | ICD-10-CM

## 2024-03-03 ENCOUNTER — Ambulatory Visit
Admission: RE | Admit: 2024-03-03 | Discharge: 2024-03-03 | Disposition: A | Discharge status: Home / Self Care | Source: Ambulatory Visit | Attending: Obstetrics and Gynecology | Admitting: Obstetrics and Gynecology

## 2024-03-03 DIAGNOSIS — N6489 Other specified disorders of breast: Secondary | ICD-10-CM

## 2024-03-03 DIAGNOSIS — R928 Other abnormal and inconclusive findings on diagnostic imaging of breast: Secondary | ICD-10-CM
